# Patient Record
Sex: Female | Born: 1955 | Race: White | Hispanic: No | Smoking: Current every day smoker
Health system: Southern US, Community
[De-identification: ages and names within clinical notes are randomized; demographics above are authoritative.]

## PROBLEM LIST (undated history)

## (undated) DIAGNOSIS — F1020 Alcohol dependence, uncomplicated: Secondary | ICD-10-CM

## (undated) DIAGNOSIS — N838 Other noninflammatory disorders of ovary, fallopian tube and broad ligament: Secondary | ICD-10-CM

## (undated) DIAGNOSIS — Z720 Tobacco use: Secondary | ICD-10-CM

## (undated) DIAGNOSIS — F1010 Alcohol abuse, uncomplicated: Secondary | ICD-10-CM

## (undated) DIAGNOSIS — J45909 Unspecified asthma, uncomplicated: Secondary | ICD-10-CM

## (undated) HISTORY — PX: BREAST SURGERY: SHX581

## (undated) HISTORY — PX: ABDOMINAL HYSTERECTOMY: SHX81

## (undated) HISTORY — DX: Tobacco use: Z72.0

## (undated) HISTORY — DX: Other noninflammatory disorders of ovary, fallopian tube and broad ligament: N83.8

## (undated) HISTORY — DX: Alcohol abuse, uncomplicated: F10.10

## (undated) HISTORY — PX: PLACEMENT OF BREAST IMPLANTS: SHX6334

---

## 1997-09-22 ENCOUNTER — Other Ambulatory Visit: Admission: RE | Admit: 1997-09-22 | Discharge: 1997-09-22 | Payer: Self-pay | Admitting: Obstetrics and Gynecology

## 1997-11-03 ENCOUNTER — Ambulatory Visit (HOSPITAL_COMMUNITY): Admission: RE | Admit: 1997-11-03 | Discharge: 1997-11-03 | Payer: Self-pay | Admitting: Obstetrics and Gynecology

## 1998-09-25 ENCOUNTER — Other Ambulatory Visit: Admission: RE | Admit: 1998-09-25 | Discharge: 1998-09-25 | Payer: Self-pay | Admitting: Obstetrics and Gynecology

## 1998-11-12 ENCOUNTER — Ambulatory Visit (HOSPITAL_COMMUNITY): Admission: RE | Admit: 1998-11-12 | Discharge: 1998-11-12 | Payer: Self-pay | Admitting: Obstetrics and Gynecology

## 1998-11-12 ENCOUNTER — Encounter: Payer: Self-pay | Admitting: Obstetrics and Gynecology

## 1999-10-13 ENCOUNTER — Other Ambulatory Visit: Admission: RE | Admit: 1999-10-13 | Discharge: 1999-10-13 | Payer: Self-pay | Admitting: Internal Medicine

## 1999-11-23 ENCOUNTER — Ambulatory Visit (HOSPITAL_COMMUNITY): Admission: RE | Admit: 1999-11-23 | Discharge: 1999-11-23 | Payer: Self-pay | Admitting: Internal Medicine

## 1999-11-23 ENCOUNTER — Encounter: Payer: Self-pay | Admitting: Internal Medicine

## 2000-12-13 ENCOUNTER — Encounter: Payer: Self-pay | Admitting: Obstetrics and Gynecology

## 2000-12-13 ENCOUNTER — Ambulatory Visit (HOSPITAL_COMMUNITY): Admission: RE | Admit: 2000-12-13 | Discharge: 2000-12-13 | Payer: Self-pay | Admitting: Obstetrics and Gynecology

## 2001-12-17 ENCOUNTER — Encounter: Payer: Self-pay | Admitting: Obstetrics and Gynecology

## 2001-12-17 ENCOUNTER — Ambulatory Visit (HOSPITAL_COMMUNITY): Admission: RE | Admit: 2001-12-17 | Discharge: 2001-12-17 | Payer: Self-pay | Admitting: Obstetrics and Gynecology

## 2002-11-14 ENCOUNTER — Other Ambulatory Visit: Admission: RE | Admit: 2002-11-14 | Discharge: 2002-11-14 | Payer: Self-pay | Admitting: Obstetrics and Gynecology

## 2006-11-10 ENCOUNTER — Ambulatory Visit (HOSPITAL_COMMUNITY): Admission: RE | Admit: 2006-11-10 | Discharge: 2006-11-10 | Payer: Self-pay | Admitting: *Deleted

## 2006-11-10 ENCOUNTER — Encounter (INDEPENDENT_AMBULATORY_CARE_PROVIDER_SITE_OTHER): Payer: Self-pay | Admitting: *Deleted

## 2010-01-31 DIAGNOSIS — K635 Polyp of colon: Secondary | ICD-10-CM

## 2010-01-31 HISTORY — DX: Polyp of colon: K63.5

## 2010-06-15 NOTE — Op Note (Signed)
NAMEELLYSON, RARICK               ACCOUNT NO.:  [id number]   MEDICAL RECORD NO.:  [id number]          PATIENT TYPE:  AMB   LOCATION:  ENDO                         FACILITY:  [facility]   PHYSICIAN:  Georgiana Spinner, M.D.    DATE OF BIRTH:  28-Mar-1955   DATE OF PROCEDURE:  11/10/2006  DATE OF DISCHARGE:                               OPERATIVE REPORT   PROCEDURE:  Colonoscopy.   INDICATIONS:  Colon polyp.  Colon cancer screening.   ANESTHESIA:  Fentanyl 75 mcg, Versed 7 mg.   PROCEDURE:  With the patient mildly sedated in the left lateral  decubitus position, the Pentax videoscopic colonoscope was inserted in  the rectum and passed under direct vision to the cecum, identified by  ileocecal valve and appendiceal orifice.  Of note, prep was suboptimal  in that there was thick brownish liquid material coating the colon  throughout.  The patient admitted to only taking half the prep but from  this point after clearing the cecum as best we could with rinse and  suction, the colonoscope was slowly withdrawn taking circumferential  views of colonic mucosa, stopping at approximately 25 cm from the anal  verge at which point two polyps were seen.  One was a flat small polyp  which was removed using hot biopsy forceps technique.  The other was an  upright polyp that was removed using snare cautery technique, both with  a setting of 20/150 blended current.  Both tissue polyps were retrieved  for pathology.  The endoscope was then withdrawn all the way to the  rectum which appeared normal on direct, showed hemorrhoids on  retroflexed view.  The endoscope was straightened and withdrawn.  The  patient's vital signs and pulse oximeter remained stable.  The patient  tolerated the procedure well without apparent complications.   FINDINGS:  Two polyps as described above.  Internal hemorrhoids.  Await  biopsy report.  The patient will call me for results and follow up with  me as an outpatient.       ______________________________  Georgiana Spinner, M.D.     GMO/MEDQ  D:  11/10/2006  T:  11/11/2006  Job:  [id number]

## 2010-06-15 NOTE — Op Note (Signed)
Yvette Barber, Yvette Barber               ACCOUNT NO.:  [id number]   MEDICAL RECORD NO.:  [id number]          PATIENT TYPE:  AMB   LOCATION:  ENDO                         FACILITY[facility]  [facility]   PHYSICIAN:  Georgiana Spinner, M.D.    DATE OF BIRTH:  October 03, 1955   DATE OF PROCEDURE:  11/10/2006  DATE OF DISCHARGE:                               OPERATIVE REPORT   OPERATION/PROCEDURE:  Upper endoscopy.   INDICATIONS:  Gastroesophageal reflux disease.   ANESTHESIA:  Fentanyl 100 mcg, Versed 10 mg, Phenergan 25 mg, Benadryl  12 mg.   PROCEDURE:  With the patient mildly sedated in the left lateral  decubitus position, the Pentax videoscopic endoscope was inserted in the  mouth and passed under direct vision through the esophagus, which  appeared normal, until we reached the distal esophagus and there  appeared to be some small islands of Barrett's esophagus, photographed  and biopsied.  We entered into the stomach.  Fundus, body, antrum,  duodenal bulb, and second portion of the duodenum were visualized.  From  this point the endoscope was slowly withdrawn, taking circumferential  views of duodenal mucosa until the endoscope had been pulled back into  the stomach, placed in retroflexion to view the stomach from below. The  endoscope was straightened and withdrawn taking circumferential views of  the remaining gastric and esophageal mucosa.  The patient's vital signs  and pulse oximeter remained stable.  The patient tolerated the procedure  well without apparent complications.   FINDINGS:  Questionable areas of Barrett's esophagus biopsied.  Await  biopsy reports.  The patient will call me for results and follow up with  me as an outpatient.  Proceed to colonoscopy as planned           ______________________________  Georgiana Spinner, M.D.     GMO/MEDQ  D:  11/10/2006  T:  11/11/2006  Job:  [id number]

## 2014-01-31 DIAGNOSIS — R569 Unspecified convulsions: Secondary | ICD-10-CM

## 2014-01-31 DIAGNOSIS — F10231 Alcohol dependence with withdrawal delirium: Secondary | ICD-10-CM

## 2014-01-31 DIAGNOSIS — F10931 Alcohol use, unspecified with withdrawal delirium: Secondary | ICD-10-CM

## 2014-01-31 HISTORY — DX: Alcohol dependence with withdrawal delirium: F10.231

## 2014-01-31 HISTORY — DX: Alcohol use, unspecified with withdrawal delirium: F10.931

## 2014-01-31 HISTORY — DX: Unspecified convulsions: R56.9

## 2016-02-01 DIAGNOSIS — R569 Unspecified convulsions: Secondary | ICD-10-CM

## 2016-02-01 DIAGNOSIS — F10939 Alcohol use, unspecified with withdrawal, unspecified: Secondary | ICD-10-CM

## 2016-02-01 DIAGNOSIS — Z87898 Personal history of other specified conditions: Secondary | ICD-10-CM

## 2016-02-01 DIAGNOSIS — F10239 Alcohol dependence with withdrawal, unspecified: Secondary | ICD-10-CM

## 2016-02-01 HISTORY — DX: Alcohol dependence with withdrawal, unspecified: F10.239

## 2016-02-01 HISTORY — DX: Personal history of other specified conditions: Z87.898

## 2016-02-01 HISTORY — DX: Alcohol use, unspecified with withdrawal, unspecified: F10.939

## 2016-02-01 HISTORY — DX: Unspecified convulsions: R56.9

## 2016-11-07 ENCOUNTER — Encounter (HOSPITAL_COMMUNITY): Payer: Self-pay | Admitting: Emergency Medicine

## 2016-11-07 ENCOUNTER — Emergency Department (HOSPITAL_COMMUNITY): Payer: Self-pay

## 2016-11-07 ENCOUNTER — Inpatient Hospital Stay (HOSPITAL_COMMUNITY)
Admission: EM | Admit: 2016-11-07 | Discharge: 2016-11-10 | DRG: 897 | Disposition: A | Discharge status: Home / Self Care | Payer: Self-pay | Attending: Internal Medicine | Admitting: Internal Medicine

## 2016-11-07 DIAGNOSIS — F10939 Alcohol use, unspecified with withdrawal, unspecified: Secondary | ICD-10-CM | POA: Diagnosis present

## 2016-11-07 DIAGNOSIS — F10229 Alcohol dependence with intoxication, unspecified: Secondary | ICD-10-CM | POA: Diagnosis present

## 2016-11-07 DIAGNOSIS — R4702 Dysphasia: Secondary | ICD-10-CM | POA: Diagnosis present

## 2016-11-07 DIAGNOSIS — Y900 Blood alcohol level of less than 20 mg/100 ml: Secondary | ICD-10-CM | POA: Diagnosis present

## 2016-11-07 DIAGNOSIS — F419 Anxiety disorder, unspecified: Secondary | ICD-10-CM | POA: Diagnosis present

## 2016-11-07 DIAGNOSIS — Z90710 Acquired absence of both cervix and uterus: Secondary | ICD-10-CM

## 2016-11-07 DIAGNOSIS — N838 Other noninflammatory disorders of ovary, fallopian tube and broad ligament: Secondary | ICD-10-CM | POA: Diagnosis present

## 2016-11-07 DIAGNOSIS — R569 Unspecified convulsions: Secondary | ICD-10-CM

## 2016-11-07 DIAGNOSIS — F1010 Alcohol abuse, uncomplicated: Secondary | ICD-10-CM | POA: Diagnosis present

## 2016-11-07 DIAGNOSIS — Z811 Family history of alcohol abuse and dependence: Secondary | ICD-10-CM

## 2016-11-07 DIAGNOSIS — Z9882 Breast implant status: Secondary | ICD-10-CM

## 2016-11-07 DIAGNOSIS — F17210 Nicotine dependence, cigarettes, uncomplicated: Secondary | ICD-10-CM | POA: Diagnosis present

## 2016-11-07 DIAGNOSIS — Z8249 Family history of ischemic heart disease and other diseases of the circulatory system: Secondary | ICD-10-CM

## 2016-11-07 DIAGNOSIS — N839 Noninflammatory disorder of ovary, fallopian tube and broad ligament, unspecified: Secondary | ICD-10-CM | POA: Diagnosis present

## 2016-11-07 DIAGNOSIS — G459 Transient cerebral ischemic attack, unspecified: Secondary | ICD-10-CM

## 2016-11-07 DIAGNOSIS — I700 Atherosclerosis of aorta: Secondary | ICD-10-CM | POA: Diagnosis present

## 2016-11-07 DIAGNOSIS — I071 Rheumatic tricuspid insufficiency: Secondary | ICD-10-CM | POA: Diagnosis present

## 2016-11-07 DIAGNOSIS — Z720 Tobacco use: Secondary | ICD-10-CM

## 2016-11-07 DIAGNOSIS — J45909 Unspecified asthma, uncomplicated: Secondary | ICD-10-CM | POA: Diagnosis present

## 2016-11-07 DIAGNOSIS — R4701 Aphasia: Secondary | ICD-10-CM | POA: Diagnosis present

## 2016-11-07 DIAGNOSIS — R443 Hallucinations, unspecified: Secondary | ICD-10-CM | POA: Diagnosis not present

## 2016-11-07 DIAGNOSIS — F10239 Alcohol dependence with withdrawal, unspecified: Principal | ICD-10-CM | POA: Diagnosis present

## 2016-11-07 HISTORY — DX: Alcohol dependence, uncomplicated: F10.20

## 2016-11-07 HISTORY — DX: Unspecified asthma, uncomplicated: J45.909

## 2016-11-07 LAB — COMPREHENSIVE METABOLIC PANEL
ALT: 18 U/L (ref 14–54)
ANION GAP: 15 (ref 5–15)
AST: 23 U/L (ref 15–41)
Albumin: 4.3 g/dL (ref 3.5–5.0)
Alkaline Phosphatase: 73 U/L (ref 38–126)
BILIRUBIN TOTAL: 0.9 mg/dL (ref 0.3–1.2)
BUN: 12 mg/dL (ref 6–20)
CHLORIDE: 96 mmol/L — AB (ref 101–111)
CO2: 26 mmol/L (ref 22–32)
Calcium: 9 mg/dL (ref 8.9–10.3)
Creatinine, Ser: 0.7 mg/dL (ref 0.44–1.00)
Glucose, Bld: 97 mg/dL (ref 65–99)
Potassium: 3.9 mmol/L (ref 3.5–5.1)
Sodium: 137 mmol/L (ref 135–145)
TOTAL PROTEIN: 8 g/dL (ref 6.5–8.1)

## 2016-11-07 LAB — URINALYSIS, ROUTINE W REFLEX MICROSCOPIC
Bilirubin Urine: NEGATIVE
GLUCOSE, UA: NEGATIVE mg/dL
Hgb urine dipstick: NEGATIVE
Ketones, ur: 80 mg/dL — AB
NITRITE: NEGATIVE
Protein, ur: 30 mg/dL — AB
SPECIFIC GRAVITY, URINE: 1.026 (ref 1.005–1.030)
pH: 5 (ref 5.0–8.0)

## 2016-11-07 LAB — RAPID URINE DRUG SCREEN, HOSP PERFORMED
Amphetamines: NOT DETECTED
BARBITURATES: NOT DETECTED
Benzodiazepines: POSITIVE — AB
Cocaine: NOT DETECTED
OPIATES: NOT DETECTED
TETRAHYDROCANNABINOL: NOT DETECTED

## 2016-11-07 LAB — CBC
HEMATOCRIT: 40.5 % (ref 36.0–46.0)
HEMOGLOBIN: 13.9 g/dL (ref 12.0–15.0)
MCH: 31.3 pg (ref 26.0–34.0)
MCHC: 34.3 g/dL (ref 30.0–36.0)
MCV: 91.2 fL (ref 78.0–100.0)
Platelets: 277 10*3/uL (ref 150–400)
RBC: 4.44 MIL/uL (ref 3.87–5.11)
RDW: 13.4 % (ref 11.5–15.5)
WBC: 10.2 10*3/uL (ref 4.0–10.5)

## 2016-11-07 LAB — I-STAT CHEM 8, ED
BUN: 12 mg/dL (ref 6–20)
CALCIUM ION: 1.09 mmol/L — AB (ref 1.15–1.40)
CREATININE: 0.6 mg/dL (ref 0.44–1.00)
Chloride: 97 mmol/L — ABNORMAL LOW (ref 101–111)
Glucose, Bld: 93 mg/dL (ref 65–99)
HEMATOCRIT: 42 % (ref 36.0–46.0)
HEMOGLOBIN: 14.3 g/dL (ref 12.0–15.0)
Potassium: 4.1 mmol/L (ref 3.5–5.1)
SODIUM: 136 mmol/L (ref 135–145)
TCO2: 29 mmol/L (ref 22–32)

## 2016-11-07 LAB — DIFFERENTIAL
BASOS ABS: 0 10*3/uL (ref 0.0–0.1)
BASOS PCT: 0 %
EOS ABS: 0 10*3/uL (ref 0.0–0.7)
EOS PCT: 0 %
LYMPHS ABS: 1 10*3/uL (ref 0.7–4.0)
Lymphocytes Relative: 10 %
MONO ABS: 0.7 10*3/uL (ref 0.1–1.0)
MONOS PCT: 7 %
Neutro Abs: 8.4 10*3/uL — ABNORMAL HIGH (ref 1.7–7.7)
Neutrophils Relative %: 83 %

## 2016-11-07 LAB — I-STAT TROPONIN, ED: TROPONIN I, POC: 0.01 ng/mL (ref 0.00–0.08)

## 2016-11-07 LAB — I-STAT BETA HCG BLOOD, ED (MC, WL, AP ONLY)

## 2016-11-07 LAB — APTT: APTT: 26 s (ref 24–36)

## 2016-11-07 LAB — ETHANOL

## 2016-11-07 LAB — PROTIME-INR
INR: 0.93
Prothrombin Time: 12.3 seconds (ref 11.4–15.2)

## 2016-11-07 LAB — SALICYLATE LEVEL

## 2016-11-07 LAB — ACETAMINOPHEN LEVEL

## 2016-11-07 MED ORDER — LORAZEPAM 1 MG PO TABS: 0.0000 mg | ORAL_TABLET | Freq: Four times a day (QID) | ORAL | Status: DC

## 2016-11-07 MED ORDER — THIAMINE HCL 100 MG/ML IJ SOLN
100.0000 mg | Freq: Every day | INTRAMUSCULAR | Status: DC
  Filled 2016-11-07: qty 2

## 2016-11-07 MED ORDER — VITAMIN B-1 100 MG PO TABS
100.0000 mg | ORAL_TABLET | Freq: Every day | ORAL | Status: DC
  Administered 2016-11-08 – 2016-11-10 (×3): 100 mg via ORAL
  Filled 2016-11-07 (×3): qty 1

## 2016-11-07 MED ORDER — LORAZEPAM 2 MG/ML IJ SOLN
0.0000 mg | Freq: Two times a day (BID) | INTRAMUSCULAR | Status: DC
Start: 2016-11-10 — End: 2016-11-08

## 2016-11-07 MED ORDER — LORAZEPAM 2 MG/ML IJ SOLN
0.0000 mg | Freq: Four times a day (QID) | INTRAMUSCULAR | Status: DC
Start: 2016-11-07 — End: 2016-11-08
  Administered 2016-11-07 – 2016-11-08 (×2): 1 mg via INTRAVENOUS
  Filled 2016-11-07 (×2): qty 1

## 2016-11-07 MED ORDER — LORAZEPAM 1 MG PO TABS: 0.0000 mg | ORAL_TABLET | Freq: Two times a day (BID) | ORAL | Status: DC

## 2016-11-07 NOTE — H&P (Signed)
Yvette Barber FGH:[id number] DOB: May 13, 1955 DOA: 11/07/2016     PCP: Patient, No Pcp Per   Outpatient Specialists: None   Patient coming from:    home Lives alone,        Chief Complaint: Alcohol related withdrawal seizures  HPI: Yvette Barber is a 61 y.o. female with medical history significant of alcohol abuse, hx of alcohol withdrawal seizure and DT's 2 yeas ago     Presented with seizure-like activity reported drinking 12 beers a day but try to stop for past 3 days. Develop seizure-like activity her friends called EMS initially but patient refuses transport she continued to have seizures while at home described as freezing and hands shaking and in front of her unable to talk while this was happening. She does have episode of postictal state she was given Versed in  Route  In ER patient was noted to have hard time speaking otherwise no localized symptoms such as numbness tingling or weakness no headache or head injury recently no chest pain shortness of breath. Reports trouble speaking for past 3 days with expressive aphasia that is intermittent. Reports last ETOH was one beer today She is trying to quit. Have had nausea and vomiting today.  Repots last seizure episode was 2 years ago also  due to Alcohol withdrawal.  Patient states she had had a hysterectomy at the age of 77 secondary to endometriosis and fibroids. She has been having abdominal distention for the past year and a half she fought that was secondary to weight gain. Patient has no family in the area she is currently staying with her friend.  IN ER:  Temp (24hrs), Avg:98.5 F (36.9 C), Min:98.4 F (36.9 C), Max:98.6 F (37 C)      on arrival  ED Triage Vitals  Enc Vitals Group     BP 11/07/16 2011 (!) 147/93     Pulse Rate 11/07/16 2011 91     Resp 11/07/16 2011 17     Temp 11/07/16 2011 98.6 F (37 C)     Temp Source 11/07/16 2011 Oral     SpO2 11/07/16 2008 97 %     Weight 11/07/16 2011 150 lb (68  kg)     Height 11/07/16 2011 5\' 5"  (1.651 m)     Head Circumference --      Peak Flow --      Pain Score --      Pain Loc --      Pain Edu? --      Excl. in [place]? --     Latest RRR 17 satting 96% HR 82 BP 99/85  Na 136 K 4.1 BUN 12 glucose 93 Trop 0.01 INr 0.93 WCB 10.2 CV89.3 plt 810  Salicylate and tylenol undetectable  Alcohol level negative  CT head no acute changes  Following Medications were ordered in ER: Medications  LORazepam (ATIVAN) injection 0-4 mg (1 mg Intravenous Given 11/07/16 2245)    Or  LORazepam (ATIVAN) tablet 0-4 mg ( Oral See Alternative 11/07/16 2245)  LORazepam (ATIVAN) injection 0-4 mg (not administered)    Or  LORazepam (ATIVAN) tablet 0-4 mg (not administered)  thiamine (VITAMIN B-1) tablet 100 mg (not administered)    Or  thiamine (B-1) injection 100 mg (not administered)     ER provider discussed case with: Neurology Who recommends: CT angiogram head and neck transfer to [facility] for MRI of the brain and EEG monitoring We'll see patient in consult on arrival  Hospitalist was called for admission for alcohol withdrawal seizures and expressive aphasia  Review of Systems:    Pertinent positives include: Trouble speaking seizure-like episode  Constitutional:  No weight loss, night sweats, Fevers, chills, fatigue, weight loss  HEENT:  No headaches, Difficulty swallowing,Tooth/dental problems,Sore throat,  No sneezing, itching, ear ache, nasal congestion, post nasal drip,  Cardio-vascular:  No chest pain, Orthopnea, PND, anasarca, dizziness, palpitations.no Bilateral lower extremity swelling  GI:  No heartburn, indigestion, abdominal pain, nausea, vomiting, diarrhea, change in bowel habits, loss of appetite, melena, blood in stool, hematemesis Resp:  no shortness of breath at rest. No dyspnea on exertion, No excess mucus, no productive cough, No non-productive cough, No coughing up of blood.No change in color of mucus.No wheezing. Skin:    no rash or lesions. No jaundice GU:  no dysuria, change in color of urine, no urgency or frequency. No straining to urinate.  No flank pain.  Musculoskeletal:  No joint pain or no joint swelling. No decreased range of motion. No back pain.  Psych:  No change in mood or affect. No depression or anxiety. No memory loss.  Neuro: no localizing neurological complaints, no tingling, no weakness, no double vision, no gait abnormality, no slurred speech, no confusion  As per HPI otherwise 10 point review of systems negative.   Past Medical History: Past Medical History:  Diagnosis Date  . Alcoholism ([place])   . Asthma    Past Surgical History:  Procedure Laterality Date  . ABDOMINAL HYSTERECTOMY    . BREAST SURGERY    . PLACEMENT OF BREAST IMPLANTS       Social History:  Ambulatory  independently    reports that she has been smoking Cigarettes.  She has been smoking about 1.00 pack per day. She has never used smokeless tobacco. She reports that she drinks alcohol. She reports that she does not use drugs.  Allergies:  No Known Allergies     Family History:   Family History  Problem Relation Age of Onset  . Alcohol abuse Father   . CAD Other   . Dementia Mother   . Diabetes Neg Hx     Medications: Prior to Admission medications   Medication Sig Start Date End Date Taking? Authorizing Provider  loratadine (CLARITIN) 10 MG tablet Take 10 mg by mouth daily as needed for allergies.   Yes [provider]  Multiple Vitamins-Minerals (MULTIVITAMIN ADULT) TABS Take 1 tablet by mouth daily.   Yes [provider]    Physical Exam: Patient Vitals for the past 24 hrs:  BP Temp Temp src Pulse Resp SpO2 Height Weight  11/07/16 2300 99/85 - - 82 17 96 % - -  11/07/16 2239 - 98.5 F (36.9 C) - - - - - -  11/07/16 2239 - 98.4 F (36.9 C) - - - - - -  11/07/16 2232 - - - - - 99 % - -  11/07/16 2231 133/83 98.6 F (37 C) Oral 88 17 99 % - -  11/07/16 2230 133/83  - - 81 14 99 % - -  11/07/16 2229 (!) 145/91 - - (!) 106 - - - -  11/07/16 2200 140/86 - - 88 (!) 24 99 % - -  11/07/16 2130 130/76 - - 88 20 98 % - -  11/07/16 2100 (!) 149/82 - - 94 18 98 % - -  11/07/16 2030 129/80 - - 90 17 97 % - -  11/07/16 2011 (!) 147/93 98.6 F (  37 C) Oral 91 17 98 % 5\' 5"  (1.651 m) 68 kg (150 lb)  11/07/16 2008 - - - - - 97 % - -    1. General:  in No Acute distress Chronically ill-appearing 2. Psychological: Alert and  Oriented 3. Head/ENT: Dry Mucous Membranes                          Head Non traumatic, neck supple                           Poor Dentition 4. SKIN:   decreased Skin turgor,  Skin clean Dry and intact no rash 5. Heart: Regular rate and rhythm no  Murmur, no Rub or gallop 6. Lungs: Clear to auscultation bilaterally, no wheezes or crackles   7. Abdomen: Soft,  non-tender,  distended large palpable mass  bowel sounds present 8. Lower extremities: no clubbing, cyanosis, or edema 9. Neurologically   strength 5 out of 5 in all 4 extremities cranial nerves II through XII intact 10. MSK: Normal range of motion   body mass index is 24.96 kg/m.  Labs on Admission:   Labs on Admission: I have personally reviewed following labs and imaging studies  CBC:  Recent Labs Lab 11/07/16 2155 11/07/16 2216  WBC 10.2  --   NEUTROABS 8.4*  --   HGB 13.9 14.3  HCT 40.5 42.0  MCV 91.2  --   PLT 277  --    Basic Metabolic Panel:  Recent Labs Lab 11/07/16 2155 11/07/16 2216  NA 137 136  K 3.9 4.1  CL 96* 97*  CO2 26  --   GLUCOSE 97 93  BUN 12 12  CREATININE 0.70 0.60  CALCIUM 9.0  --    GFR: Estimated Creatinine Clearance: 67.3 mL/min (by C-G formula based on SCr of 0.6 mg/dL). Liver Function Tests:  Recent Labs Lab 11/07/16 2155  AST 23  ALT 18  ALKPHOS 73  BILITOT 0.9  PROT 8.0  ALBUMIN 4.3   No results for input(s): LIPASE, AMYLASE in the last 168 hours. No results for input(s): AMMONIA in the last 168  hours. Coagulation Profile:  Recent Labs Lab 11/07/16 2155  INR 0.93   Cardiac Enzymes: No results for input(s): CKTOTAL, CKMB, CKMBINDEX, TROPONINI in the last 168 hours. BNP (last 3 results) No results for input(s): PROBNP in the last 8760 hours. HbA1C: No results for input(s): HGBA1C in the last 72 hours. CBG: No results for input(s): GLUCAP in the last 168 hours. Lipid Profile: No results for input(s): CHOL, HDL, LDLCALC, TRIG, CHOLHDL, LDLDIRECT in the last 72 hours. Thyroid Function Tests: No results for input(s): TSH, T4TOTAL, FREET4, T3FREE, THYROIDAB in the last 72 hours. Anemia Panel: No results for input(s): VITAMINB12, FOLATE, FERRITIN, TIBC, IRON, RETICCTPCT in the last 72 hours. Urine analysis: No results found for: COLORURINE, APPEARANCEUR, LABSPEC, PHURINE, GLUCOSEU, HGBUR, BILIRUBINUR, KETONESUR, PROTEINUR, UROBILINOGEN, NITRITE, LEUKOCYTESUR Sepsis Labs: @LABRCNTIP (procalcitonin:4,lacticidven:4) )No results found for this or any previous visit (from the past 240 hour(s)).    UA ordered  No results found for: HGBA1C  Estimated Creatinine Clearance: 67.3 mL/min (by C-G formula based on SCr of 0.6 mg/dL).  BNP (last 3 results) No results for input(s): PROBNP in the last 8760 hours.   ECG REPORT  Independently reviewed Rate: 92  Rhythm: right axis deviation ST&T Change: No acute ischemic changes  QTC 448  Filed Weights   11/07/16  2011  Weight: 68 kg (150 lb)     Cultures: No results found for: SDES, SPECREQUEST, CULT, REPTSTATUS   Radiological Exams on Admission: Ct Abdomen Pelvis Wo Contrast  Result Date: 11/08/2016 CLINICAL DATA:  Acute onset of generalized abdominal distention and abdominal pain. Initial encounter. EXAM: CT ABDOMEN AND PELVIS WITHOUT CONTRAST TECHNIQUE: Multidetector CT imaging of the abdomen and pelvis was performed following the standard protocol without IV contrast. COMPARISON:  None. FINDINGS: Lower chest: Minimal left  basilar scarring is noted. Mild coronary artery calcifications are seen. There is dense calcification about the patient's bilateral breast implants. Hepatobiliary: The liver is unremarkable in appearance. The gallbladder is unremarkable in appearance. The common bile duct remains normal in caliber. Pancreas: The pancreas is within normal limits. Spleen: A tiny hypodensity within the spleen is nonspecific. The spleen is otherwise unremarkable. Adrenals/Urinary Tract: The adrenal glands are unremarkable in appearance. A small left renal cyst is noted. The kidneys are difficult to fully assess due to contrast in the renal calyces and ureters. No obstructing ureteral stones are identified. No perinephric stranding is seen. There is no evidence of hydronephrosis. Stomach/Bowel: The stomach is unremarkable in appearance. The small bowel is within normal limits. The appendix is not well characterized; there is no evidence for appendicitis. Mild diverticulosis is noted at the mid sigmoid colon, without evidence of diverticulitis. Vascular/Lymphatic: Scattered calcification is seen along the abdominal aorta and its branches. The abdominal aorta is otherwise grossly unremarkable. The inferior vena cava is grossly unremarkable. No retroperitoneal lymphadenopathy is seen. No pelvic sidewall lymphadenopathy is identified. Reproductive: There is a very large complex cystic mass occupying the lower abdomen and pelvis, measuring approximately 21.4 x 21.3 x 9.8 cm. 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 A small amount of associated free fluid is noted within the pelvis. The patient is status post hysterectomy. The bladder is largely decompressed and grossly unremarkable, with a small amount of contrast noted in the bladder. Other: No additional soft tissue abnormalities are seen. Musculoskeletal: No acute osseous abnormalities  are identified. Endplate sclerotic change is noted at L4-L5, with vacuum phenomenon at the lower lumbar spine. The visualized musculature is unremarkable in appearance. IMPRESSION: 1. Very large complex cystic mass occupying the lower abdomen and pelvis, measuring approximately 21.4 x 21.3 x 9.8 cm. 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 Small amount of associated free fluid within the pelvis. 2. Scattered aortic atherosclerosis. 3. Mild coronary artery calcification. 4. Mild diverticulosis at the mid sigmoid colon, without evidence of diverticulitis. 5. Small left renal cyst. These results were called by telephone at the time of interpretation on 11/08/2016 at 3:15 am to Dr. Roxanne Mins, who verbally acknowledged these results. Electronically Signed   By: Garald Balding M.D.   On: 11/08/2016 02:01   Ct Angio Head W Or Wo Contrast  Result Date: 11/08/2016 CLINICAL DATA:  Seizures EXAM: CT ANGIOGRAPHY HEAD AND NECK TECHNIQUE: Multidetector CT imaging of the head and neck was performed using the standard protocol during bolus administration of intravenous contrast. Multiplanar CT image reconstructions and MIPs were obtained to evaluate the vascular anatomy. Carotid stenosis measurements (when applicable) are obtained utilizing NASCET criteria, using the distal internal carotid diameter as the denominator. CONTRAST:  100 mL Isovue 370 COMPARISON:  Head CT 11/07/2016 FINDINGS: CTA NECK FINDINGS Aortic arch: There  is no aneurysm or dissection of the visualized ascending aorta or aortic arch. Normal 3 vessel aortic branching pattern. The visualized proximal subclavian arteries are normal. Mild calcification of the aortic arch. Right carotid system: The right common carotid origin is widely patent. There is no common carotid or internal carotid artery dissection or aneurysm. Mixed calcified and noncalcified plaque at the right  carotid bifurcation without associated hemodynamically significant stenosis. Left carotid system: The left common carotid origin is widely patent. There is no common carotid or internal carotid artery dissection or aneurysm. There is noncalcified plaque within the left common carotid artery causing less than 50% stenosis. There is mixed calcified and noncalcified plaque at the carotid bifurcation extending into the proximal left ICA, causing no hemodynamically significant stenosis. Vertebral arteries: The vertebral system is codominant. Both vertebral artery origins are normal. Both vertebral arteries are normal to their confluence with the basilar artery. Skeleton: There is no bony spinal canal stenosis. No lytic or blastic lesions. Other neck: The nasopharynx is clear. The oropharynx and hypopharynx are normal. The epiglottis is normal. The supraglottic larynx, glottis and subglottic larynx are normal. No retropharyngeal collection. The parapharyngeal spaces are preserved. The parotid and submandibular glands are normal. No sialolithiasis or salivary ductal dilatation. The thyroid gland is normal. There is no cervical lymphadenopathy. Upper chest: No pneumothorax or pleural effusion. No nodules or masses. Review of the MIP images confirms the above findings CTA HEAD FINDINGS Anterior circulation: --Intracranial internal carotid arteries: Multifocal atherosclerotic disease of both internal carotid arteries at the skullbase without flow-limiting stenosis. --Anterior cerebral arteries: Normal. --Middle cerebral arteries: Normal. --Posterior communicating arteries: Absent bilaterally. Posterior circulation: --Posterior cerebral arteries: Normal. --Superior cerebellar arteries: Normal. --Basilar artery: Normal. --Anterior inferior cerebellar arteries: Normal. --Posterior inferior cerebellar arteries: Normal. Venous sinuses: As permitted by contrast timing, patent. Anatomic variants: None Delayed phase: No parenchymal  contrast enhancement. Review of the MIP images confirms the above findings IMPRESSION: 1. No emergent large vessel occlusion or high-grade intracranial stenosis. 2. Bilateral mixed calcified and noncalcified atherosclerosis of the common carotid, cervical internal carotid and proximal intracranial internal carotid arteries. No hemodynamically significant stenosis. 3.  Aortic Atherosclerosis (ICD10-I70.0). Electronically Signed   By: Ulyses Jarred M.D.   On: 11/08/2016 01:33   Ct Head Wo Contrast  Result Date: 11/07/2016 CLINICAL DATA:  Seizure streak alcohol withdrawal. EXAM: CT HEAD WITHOUT CONTRAST TECHNIQUE: Contiguous axial images were obtained from the base of the skull through the vertex without intravenous contrast. COMPARISON:  None. FINDINGS: Brain: No evidence of acute infarction, hemorrhage, hydrocephalus, extra-axial collection or mass lesion/mass effect. Mild brain parenchymal volume loss and periventricular microangiopathy. Vascular: Calcific atherosclerotic disease at the skullbase. Skull: Normal. Negative for fracture or focal lesion. Sinuses/Orbits: No acute finding. Other: None. IMPRESSION: No acute intracranial abnormality. Atrophy, chronic microvascular disease. Electronically Signed   By: Fidela Salisbury M.D.   On: 11/07/2016 22:06   Ct Angio Neck W Or Wo Contrast  Result Date: 11/08/2016 CLINICAL DATA:  Seizures EXAM: CT ANGIOGRAPHY HEAD AND NECK TECHNIQUE: Multidetector CT imaging of the head and neck was performed using the standard protocol during bolus administration of intravenous contrast. Multiplanar CT image reconstructions and MIPs were obtained to evaluate the vascular anatomy. Carotid stenosis measurements (when applicable) are obtained utilizing NASCET criteria, using the distal internal carotid diameter as the denominator. CONTRAST:  100 mL Isovue 370 COMPARISON:  Head CT 11/07/2016 FINDINGS: CTA NECK FINDINGS Aortic arch: There is no aneurysm or dissection of the  visualized ascending aorta  or aortic arch. Normal 3 vessel aortic branching pattern. The visualized proximal subclavian arteries are normal. Mild calcification of the aortic arch. Right carotid system: The right common carotid origin is widely patent. There is no common carotid or internal carotid artery dissection or aneurysm. Mixed calcified and noncalcified plaque at the right carotid bifurcation without associated hemodynamically significant stenosis. Left carotid system: The left common carotid origin is widely patent. There is no common carotid or internal carotid artery dissection or aneurysm. There is noncalcified plaque within the left common carotid artery causing less than 50% stenosis. There is mixed calcified and noncalcified plaque at the carotid bifurcation extending into the proximal left ICA, causing no hemodynamically significant stenosis. Vertebral arteries: The vertebral system is codominant. Both vertebral artery origins are normal. Both vertebral arteries are normal to their confluence with the basilar artery. Skeleton: There is no bony spinal canal stenosis. No lytic or blastic lesions. Other neck: The nasopharynx is clear. The oropharynx and hypopharynx are normal. The epiglottis is normal. The supraglottic larynx, glottis and subglottic larynx are normal. No retropharyngeal collection. The parapharyngeal spaces are preserved. The parotid and submandibular glands are normal. No sialolithiasis or salivary ductal dilatation. The thyroid gland is normal. There is no cervical lymphadenopathy. Upper chest: No pneumothorax or pleural effusion. No nodules or masses. Review of the MIP images confirms the above findings CTA HEAD FINDINGS Anterior circulation: --Intracranial internal carotid arteries: Multifocal atherosclerotic disease of both internal carotid arteries at the skullbase without flow-limiting stenosis. --Anterior cerebral arteries: Normal. --Middle cerebral arteries: Normal. --Posterior  communicating arteries: Absent bilaterally. Posterior circulation: --Posterior cerebral arteries: Normal. --Superior cerebellar arteries: Normal. --Basilar artery: Normal. --Anterior inferior cerebellar arteries: Normal. --Posterior inferior cerebellar arteries: Normal. Venous sinuses: As permitted by contrast timing, patent. Anatomic variants: None Delayed phase: No parenchymal contrast enhancement. Review of the MIP images confirms the above findings IMPRESSION: 1. No emergent large vessel occlusion or high-grade intracranial stenosis. 2. Bilateral mixed calcified and noncalcified atherosclerosis of the common carotid, cervical internal carotid and proximal intracranial internal carotid arteries. No hemodynamically significant stenosis. 3.  Aortic Atherosclerosis (ICD10-I70.0). Electronically Signed   By: Ulyses Jarred M.D.   On: 11/08/2016 01:33    Chart has been reviewed    Assessment/Plan   61 y.o. 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 TIA (transient ischemic attack) discussed and neurology will transfer to [facility] obtain MRI of the brain this also be helpful to evaluate for any metastatic spread as well as Wernicke's Korsakoff syndrome . Alcohol withdrawal ([place]) And history of DTs and withdrawal seizures Order CIWA protocol monitor and step down if patient stabilizes can transfer to telemetry, administer IV thiamine . 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 Aphasia intermittent aphasia currently seems to be improving evaluate for possible TIA/CVA/metastases . Alcohol abuse social work consult ordered Other plan as per orders.  DVT prophylaxis:  SCD   Code Status:   FULL CODE   as per patient    Family Communication:   Family not  at  Bedside    Disposition Plan     To home once workup is complete and patient is stable  Would benefit from PT/OT eval prior to DC  ordered                       Social Work  Nutrition  consulted                          Consults called: Neurology    Admission status:    obs   Level of care    SDU      I have spent a total of 56 min on this admission   Mayumi Summerson 11/08/2016, 2:33 AM    [facility]  Pager [phone]   after 2 AM please page floor coverage PA If 7AM-7PM, please contact the day team taking care of the patient  [email or web address]  Password [email or web address]

## 2016-11-07 NOTE — ED Notes (Signed)
Bed: ZQ94 Expected date:  Expected time:  Means of arrival:  Comments: EMS ETOH Withdrawal-seizures

## 2016-11-07 NOTE — ED Notes (Signed)
Pt is aware a urine sample is needed, but is unable to provide one at this time. 

## 2016-11-07 NOTE — ED Notes (Signed)
Patient transported to CT 

## 2016-11-07 NOTE — ED Triage Notes (Signed)
Pt BIB EMS for seizures due to alcohol withdraw. Patient has no hx of seizures. Patient has been drinking 12 beers a day, which is less than she normally drinks. The individuals that the patient is staying with called EMS yesterday for same but patient refused transport. Per the people in the household, patient had 6-7 seizures. Per EMS, patient presenting with seizures characterized by freezing, hands shaking out in front of her, and attempting to talk but unable to. Patient then stops shaking and goes into a post-ictal state. Patient agitated and behavior bizarre on arrival. Patient given 2.5 mg versed en route. No seizure activity since versed given.

## 2016-11-07 NOTE — ED Provider Notes (Signed)
[facility] DEPT Provider Note   CSN: [id number] Arrival date & time: 11/07/16  2006     History   Chief Complaint Chief Complaint  Patient presents with  . Alcohol Intoxication  . Seizures    HPI Yvette Barber is a 61 y.o. female.  The history is provided by medical records and the patient.  Seizures   This is a new problem. The current episode started 2 days ago. The problem has not changed since onset.There were 6 to 10 seizures. The most recent episode lasted 30 to 120 seconds. Associated symptoms include confusion and speech difficulty. Pertinent negatives include no headaches, no visual disturbance, no chest pain, no cough, no nausea, no vomiting and no diarrhea. Characteristics include rhythmic jerking and loss of consciousness. The episode was witnessed. The seizures did not continue in the ED. Possible causes include change in alcohol use. There has been no fever.    Past Medical History:  Diagnosis Date  . Alcoholism ([place])   . Asthma     There are no active problems to display for this patient.   Past Surgical History:  Procedure Laterality Date  . ABDOMINAL HYSTERECTOMY    . BREAST SURGERY    . PLACEMENT OF BREAST IMPLANTS      OB History    No data available       Home Medications    Prior to Admission medications   Medication Sig Start Date End Date Taking? Authorizing Provider  loratadine (CLARITIN) 10 MG tablet Take 10 mg by mouth daily as needed for allergies.   Yes [provider]  Multiple Vitamins-Minerals (MULTIVITAMIN ADULT) TABS Take 1 tablet by mouth daily.   Yes [provider]    Family History No family history on file.  Social History Social History  Substance Use Topics  . Smoking status: Current Every Day Smoker    Packs/day: 1.00    Types: Cigarettes  . Smokeless tobacco: Never Used  . Alcohol use Yes     Comment: 13 beers per day     Allergies   Patient has no known allergies.   Review of  Systems Review of Systems  Constitutional: Negative for chills, diaphoresis, fatigue and fever.  HENT: Negative for congestion and rhinorrhea.   Eyes: Negative for visual disturbance.  Respiratory: Negative for cough, chest tightness and shortness of breath.   Cardiovascular: Negative for chest pain and palpitations.  Gastrointestinal: Negative for abdominal pain, diarrhea, nausea and vomiting.  Genitourinary: Negative for dyspareunia and flank pain.  Musculoskeletal: Negative for neck pain and neck stiffness.  Skin: Negative for rash and wound.  Neurological: Positive for seizures, loss of consciousness and speech difficulty. Negative for dizziness, light-headedness, numbness and headaches.  Psychiatric/Behavioral: Positive for confusion.  All other systems reviewed and are negative.    Physical Exam Updated Vital Signs BP (!) 147/93 (BP Location: Left Arm)   Pulse 91   Temp 98.6 F (37 C) (Oral)   Resp 17   Ht 5\' 5"  (1.651 m)   Wt 68 kg (150 lb)   SpO2 98%   BMI 24.96 kg/m   Physical Exam  Constitutional: She is oriented to person, place, and time. She appears well-developed and well-nourished. No distress.  HENT:  Head: Normocephalic and atraumatic.  Mouth/Throat: Oropharynx is clear and moist. No oropharyngeal exudate.  Eyes: Pupils are equal, round, and reactive to light. Conjunctivae and EOM are normal.  Neck: Normal range of motion.  Cardiovascular: Normal rate and intact distal pulses.  No murmur heard. Pulmonary/Chest: Effort normal and breath sounds normal. No stridor. No respiratory distress. She has no wheezes. She exhibits no tenderness.  Abdominal: Soft. There is no tenderness.  Musculoskeletal: She exhibits no tenderness.  Neurological: She is alert and oriented to person, place, and time. She is not disoriented. She displays no tremor. No cranial nerve deficit or sensory deficit. She exhibits normal muscle tone. Coordination and gait normal. GCS eye subscore  is 4. GCS verbal subscore is 5. GCS motor subscore is 6.  Intermittent expressive aphasia.   Skin: Capillary refill takes less than 2 seconds. No rash noted. She is not diaphoretic. No erythema.  Psychiatric: She has a normal mood and affect.  Nursing note and vitals reviewed.    ED Treatments / Results  Labs (all labs ordered are listed, but only abnormal results are displayed) Labs Reviewed  DIFFERENTIAL - Abnormal; Notable for the following:       Result Value   Neutro Abs 8.4 (*)    All other components within normal limits  COMPREHENSIVE METABOLIC PANEL - Abnormal; Notable for the following:    Chloride 96 (*)    All other components within normal limits  URINALYSIS, ROUTINE W REFLEX MICROSCOPIC - Abnormal; Notable for the following:    APPearance HAZY (*)    Ketones, ur 80 (*)    Protein, ur 30 (*)    Leukocytes, UA TRACE (*)    Bacteria, UA RARE (*)    Squamous Epithelial / LPF 6-30 (*)    All other components within normal limits  RAPID URINE DRUG SCREEN, HOSP PERFORMED - Abnormal; Notable for the following:    Benzodiazepines POSITIVE (*)    All other components within normal limits  ACETAMINOPHEN LEVEL - Abnormal; Notable for the following:    Acetaminophen (Tylenol), Serum <10 (*)    All other components within normal limits  I-STAT CHEM 8, ED - Abnormal; Notable for the following:    Chloride 97 (*)    Calcium, Ion 1.09 (*)    All other components within normal limits  PROTIME-INR  APTT  CBC  SALICYLATE LEVEL  ETHANOL  URINALYSIS, ROUTINE W REFLEX MICROSCOPIC  I-STAT TROPONIN, ED  I-STAT BETA HCG BLOOD, ED ([facility], [facility], [facility] ONLY)    EKG  EKG Interpretation  Date/Time:  Monday November 07 2016 20:18:03 EDT Ventricular Rate:  92 PR Interval:    QRS Duration: 84 QT Interval:  362 QTC Calculation: 448 R Axis:   84 Text Interpretation:  Sinus rhythm Borderline right axis deviation No prior ECG for comparison.  No STEMI Confirmed by Antony Blackbird [id number]) on  11/07/2016 10:08:32 PM       Radiology Ct Head Wo Contrast  Result Date: 11/07/2016 CLINICAL DATA:  Seizure streak alcohol withdrawal. EXAM: CT HEAD WITHOUT CONTRAST TECHNIQUE: Contiguous axial images were obtained from the base of the skull through the vertex without intravenous contrast. COMPARISON:  None. FINDINGS: Brain: No evidence of acute infarction, hemorrhage, hydrocephalus, extra-axial collection or mass lesion/mass effect. Mild brain parenchymal volume loss and periventricular microangiopathy. Vascular: Calcific atherosclerotic disease at the skullbase. Skull: Normal. Negative for fracture or focal lesion. Sinuses/Orbits: No acute finding. Other: None. IMPRESSION: No acute intracranial abnormality. Atrophy, chronic microvascular disease. Electronically Signed   By: Fidela Salisbury M.D.   On: 11/07/2016 22:06    Procedures Procedures (including critical care time)  Medications Ordered in ED Medications  LORazepam (ATIVAN) injection 0-4 mg (1 mg Intravenous Given 11/07/16 2245)    Or  LORazepam (ATIVAN) tablet 0-4 mg ( Oral See Alternative 11/07/16 2245)  LORazepam (ATIVAN) injection 0-4 mg (not administered)    Or  LORazepam (ATIVAN) tablet 0-4 mg (not administered)  thiamine (VITAMIN B-1) tablet 100 mg (not administered)    Or  thiamine (B-1) injection 100 mg (not administered)  iopamidol (ISOVUE-370) 76 % injection 100 mL (100 mLs Intravenous Contrast Given 11/08/16 0041)     Initial Impression / Assessment and Plan / ED Course  I have reviewed the triage vital signs and the nursing notes.  Pertinent labs & imaging results that were available during my care of the patient were reviewed by me and considered in my medical decision making (see chart for details).     Yvette Barber is a 61 y.o. female with a past medical history of alcoholism and asthma who presents with seizures. Patient brought in by EMS. According to EMS report to nursing, patient has had seizures for  the last 2 days. According to bystanders, patient had 6 or 7 seizures today. Patient has full body shaking and has difficulty speaking. Patient then stopped shaking and was postictal. Patient received Versed in route with EMS. No seizure seen since medications. On my evaluation, patient has intermittent expressive aphasia. Patient has a very difficult time speaking however she occasionally gets into episodes where she has minimal difficulty. She reports that she normally drinks a 12 pack a day for years but 3 days ago stop drinking. She says that she is also had one beer today There is clinical concern that her seizures are focal withdrawal seizures. Patient denies any other symptoms such as numbness, tingling, or weakness. She denies any recent injuries, headaches, nausea, vomiting, conservation, diarrhea, dysuria, palpitations, chest pain, or shortness of breath. Patient reports that her difficulty speaking has been going on for the last 3 days.  On exam, patient has a stuttering and expressive aphasia. Patient's aphasia is not constant but she gets very frustrated with her inability to communicate. Patient has no facial droop andno other focal neurologic deficits. Patient has normal sensation and strength in all extremity joints. Normal finger-nose-finger bilaterally. Lungs are clear and chest is nontender. No evidence of trauma.  On my abdominal exam, patient was nontender.   Patient on the aphasia component, there is concern for possible stroke. Patient was able to report that her speech difficulty began 3 days ago. Patient will not be a code stroke.  The patient's seizures do sound like withdrawal seizures given lack of epilepsy history and her change in alcohol consumption. CIWA score will be assessed.  Patient had CT and labs to workup for possible stroke. Alcohol negative. Labs grossly reassuring. CT head showed no acute valgus with her was chronic microvascular disease.   Neurology will be called  for the esperssive aphasia. Anticipate admission for stroke rule out and possible alcohol withdrawal seizures.  Neurology was called and they feel this is more likely a postictal phenomenon causing the speech abnormality in a setting of her alcohol withdrawal seizures. They are recommending CTA of the head and neck. They're also recommending admission and transferred to [facility] for MRI of the brain and EEG monitoring. They will see the patient on her arrival but are requesting her admission to the hosptalist service given the alcohol withdrawal.   Final Clinical Impressions(s) / ED Diagnoses   Final diagnoses:  Seizure ([place])  Expressive aphasia  Alcohol withdrawal syndrome with complication (HCC)    Clinical Impression: 1. Seizure ([place])   2.  Expressive aphasia   3. Alcohol withdrawal syndrome with complication [facility])     Disposition: Admit to Hospitalist service    Tegeler, Gwenyth Allegra, MD 11/08/16 [phone]

## 2016-11-08 ENCOUNTER — Inpatient Hospital Stay (HOSPITAL_COMMUNITY): Payer: Self-pay

## 2016-11-08 ENCOUNTER — Emergency Department (HOSPITAL_COMMUNITY): Payer: Self-pay

## 2016-11-08 ENCOUNTER — Encounter (HOSPITAL_COMMUNITY): Payer: Self-pay | Admitting: Internal Medicine

## 2016-11-08 DIAGNOSIS — R569 Unspecified convulsions: Secondary | ICD-10-CM

## 2016-11-08 DIAGNOSIS — R4701 Aphasia: Secondary | ICD-10-CM | POA: Diagnosis present

## 2016-11-08 DIAGNOSIS — F10239 Alcohol dependence with withdrawal, unspecified: Secondary | ICD-10-CM | POA: Diagnosis present

## 2016-11-08 DIAGNOSIS — G459 Transient cerebral ischemic attack, unspecified: Secondary | ICD-10-CM | POA: Insufficient documentation

## 2016-11-08 DIAGNOSIS — F1010 Alcohol abuse, uncomplicated: Secondary | ICD-10-CM | POA: Diagnosis present

## 2016-11-08 DIAGNOSIS — N838 Other noninflammatory disorders of ovary, fallopian tube and broad ligament: Secondary | ICD-10-CM | POA: Diagnosis present

## 2016-11-08 DIAGNOSIS — I361 Nonrheumatic tricuspid (valve) insufficiency: Secondary | ICD-10-CM

## 2016-11-08 DIAGNOSIS — F10939 Alcohol use, unspecified with withdrawal, unspecified: Secondary | ICD-10-CM | POA: Diagnosis present

## 2016-11-08 LAB — MRSA PCR SCREENING: MRSA BY PCR: NEGATIVE

## 2016-11-08 LAB — LIPID PANEL
CHOL/HDL RATIO: 2.9 ratio
Cholesterol: 180 mg/dL (ref 0–200)
HDL: 62 mg/dL (ref >40)
LDL CALC: 99 mg/dL (ref 0–99)
TRIGLYCERIDES: 96 mg/dL (ref <150)
VLDL: 19 mg/dL (ref 0–40)

## 2016-11-08 LAB — PHOSPHORUS: Phosphorus: 2.3 mg/dL — ABNORMAL LOW (ref 2.5–4.6)

## 2016-11-08 LAB — MAGNESIUM: Magnesium: 2.3 mg/dL (ref 1.7–2.4)

## 2016-11-08 LAB — HIV ANTIBODY (ROUTINE TESTING W REFLEX): HIV Screen 4th Generation wRfx: NONREACTIVE

## 2016-11-08 LAB — TROPONIN I: Troponin I: <0.03 ng/mL (ref <0.03)

## 2016-11-08 LAB — HEMOGLOBIN A1C
Hgb A1c MFr Bld: 5.3 % (ref 4.8–5.6)
Mean Plasma Glucose: 105.41 mg/dL

## 2016-11-08 LAB — ECHOCARDIOGRAM COMPLETE
Height: 65 in
WEIGHTICAEL: 2398.6 [oz_av]

## 2016-11-08 MED ORDER — ONDANSETRON 4 MG PO TBDP
4.0000 mg | ORAL_TABLET | Freq: Four times a day (QID) | ORAL | Status: DC | PRN
Start: 2016-11-08 — End: 2016-11-10

## 2016-11-08 MED ORDER — IOPAMIDOL (ISOVUE-370) INJECTION 76%
100.0000 mL | Freq: Once | INTRAVENOUS | Status: AC | PRN
  Administered 2016-11-08: 100 mL via INTRAVENOUS

## 2016-11-08 MED ORDER — ACETAMINOPHEN 650 MG RE SUPP: 650.0000 mg | RECTAL | Status: DC | PRN

## 2016-11-08 MED ORDER — IOPAMIDOL (ISOVUE-370) INJECTION 76%
INTRAVENOUS | Status: AC
  Administered 2016-11-08: 100 mL via INTRAVENOUS
  Filled 2016-11-08: qty 100

## 2016-11-08 MED ORDER — LORATADINE 10 MG PO TABS: 10.0000 mg | ORAL_TABLET | Freq: Every day | ORAL | Status: DC | PRN

## 2016-11-08 MED ORDER — LORAZEPAM 1 MG PO TABS: 1.0000 mg | ORAL_TABLET | Freq: Every day | ORAL | Status: DC

## 2016-11-08 MED ORDER — LORAZEPAM 1 MG PO TABS
1.0000 mg | ORAL_TABLET | Freq: Four times a day (QID) | ORAL | Status: AC
  Administered 2016-11-08 – 2016-11-09 (×3): 1 mg via ORAL
  Filled 2016-11-08 (×4): qty 1

## 2016-11-08 MED ORDER — NICOTINE 21 MG/24HR TD PT24
21.0000 mg | MEDICATED_PATCH | Freq: Every day | TRANSDERMAL | Status: DC
  Administered 2016-11-08 – 2016-11-10 (×3): 21 mg via TRANSDERMAL
  Filled 2016-11-08 (×3): qty 1

## 2016-11-08 MED ORDER — HYDROXYZINE HCL 25 MG PO TABS: 25.0000 mg | ORAL_TABLET | Freq: Four times a day (QID) | ORAL | Status: DC | PRN

## 2016-11-08 MED ORDER — ACETAMINOPHEN 325 MG PO TABS: 650.0000 mg | ORAL_TABLET | ORAL | Status: DC | PRN

## 2016-11-08 MED ORDER — LORAZEPAM 1 MG PO TABS
1.0000 mg | ORAL_TABLET | Freq: Three times a day (TID) | ORAL | Status: AC
  Administered 2016-11-09 – 2016-11-10 (×3): 1 mg via ORAL
  Filled 2016-11-08 (×3): qty 1

## 2016-11-08 MED ORDER — LOPERAMIDE HCL 2 MG PO CAPS: 2.0000 mg | ORAL_CAPSULE | ORAL | Status: DC | PRN

## 2016-11-08 MED ORDER — LORAZEPAM 1 MG PO TABS: 1.0000 mg | ORAL_TABLET | Freq: Two times a day (BID) | ORAL | Status: DC

## 2016-11-08 MED ORDER — LORAZEPAM 2 MG/ML IJ SOLN
2.0000 mg | INTRAMUSCULAR | Status: DC | PRN
  Administered 2016-11-08: 2 mg via INTRAVENOUS
  Filled 2016-11-08: qty 1

## 2016-11-08 MED ORDER — ACETAMINOPHEN 160 MG/5ML PO SOLN: 650.0000 mg | ORAL | Status: DC | PRN

## 2016-11-08 MED ORDER — STROKE: EARLY STAGES OF RECOVERY BOOK
Freq: Once | Status: AC
  Administered 2016-11-08: 06:00:00
  Filled 2016-11-08: qty 1

## 2016-11-08 MED ORDER — SODIUM CHLORIDE 0.9 % IV SOLN
INTRAVENOUS | Status: DC
  Administered 2016-11-08 – 2016-11-10 (×2): via INTRAVENOUS

## 2016-11-08 MED ORDER — SENNOSIDES-DOCUSATE SODIUM 8.6-50 MG PO TABS: 1.0000 | ORAL_TABLET | Freq: Every evening | ORAL | Status: DC | PRN

## 2016-11-08 NOTE — Procedures (Signed)
ELECTROENCEPHALOGRAM REPORT  Date of Study: 11/08/2016  Patient's Name: Yvette Barber MRN: [id number] Date of Birth: Dec 14, 1955  Referring Provider: Dr. Kerney Elbe  Clinical History: This is a 61 year old woman with seizure-like activity.  Medications: acetaminophen (TYLENOL) tablet 650 mg  loratadine (CLARITIN) tablet 10 mg  thiamine (B-1) injection 100 mg  thiamine (VITAMIN B-1) tablet 100 mg   Technical Summary: A multichannel digital EEG recording measured by the international 10-20 system with electrodes applied with paste and impedances below 5000 ohms performed in our laboratory with EKG monitoring in an awake and asleep patient.  Hyperventilation was not performed. Photic stimulation was performed.  The digital EEG was referentially recorded, reformatted, and digitally filtered in a variety of bipolar and referential montages for optimal display.    Description: The patient is awake and asleep during the recording.  During maximal wakefulness, there is a symmetric, medium voltage 10 Hz posterior dominant rhythm that attenuates with eye opening.  The record is symmetric.  There is an excess amount of diffuse low voltage beta activity seen throughout the recording. During drowsiness and sleep, there is an increase in theta slowing of the background, at times sharply contoured without clear epileptogenic potential. Positive occipital sharp transients of sleep (POSTS), vertex waves and symmetric sleep spindles were seen.  Photic stimulation did not elicit any abnormalities.  There were no epileptiform discharges or electrographic seizures seen.    EKG lead was unremarkable.  Impression: This awake and asleep EEG is normal except for excess amount of diffuse low voltage beta activity.  Clinical Correlation: Diffuse low voltage beta activity is commonly seen with sedating medications such as benzodiazepines.  In the absence of sedating medications, anxiety and hyperthyroidism may  produce generalized beta activity.  The absence of epileptiform discharges does not exclude a clinical diagnosis of epilepsy. Clinical correlation is advised.   Ellouise Newer, M.D.

## 2016-11-08 NOTE — Evaluation (Signed)
Occupational Therapy Evaluation Patient Details Name: Yvette Barber MRN: [id number] DOB: 1955-11-09 Today's Date: 11/08/2016    History of Present Illness Pt is a 61 y.o. female who was admitted following alcohol related withdrawal seizures. Pt reported drinking 12 beers a day but attempting to stop for the past 3 days. She developed seizure-like activity and friends called EMS. Episodes described as "freezing" with hands shaking. Unable to speak during episodes. Pt additionally reporting nausea/vomiting 11/07/16. PMH significant for alcoholism and asthma.    Clinical Impression   PTA, pt reports independence with ADL and functional mobility. Pt very lethargic at times and fluctuating between flat affect and agitation this session. She was able to complete basic ADL with overall supervision and VC's for initiation and continuation. She presents with decreased awareness, decreased orientation, and poor attention to task impacting her ability to participate. Feel pt will likely progress well with acute OT services and do not anticipate need for OT follow-up post-acute D/C. OT will continue to follow while admitted and update D/C recommendations as necessary.    Follow Up Recommendations  No OT follow up;Supervision/Assistance - 24 hour    Equipment Recommendations  Other (comment) (TBD)    Recommendations for Other Services       Precautions / Restrictions Precautions Precautions: Fall Precaution Comments: Seizures Restrictions Weight Bearing Restrictions: No      Mobility Bed Mobility Overal bed mobility: Needs Assistance Bed Mobility: Supine to Sit;Sit to Supine     Supine to sit: Supervision Sit to supine: Modified independent (Device/Increase time)   General bed mobility comments: VC's to initiate bed mobility to EOB.   Transfers Overall transfer level: Needs assistance Equipment used: None Transfers: Sit to/from Stand Sit to Stand: Supervision               Balance Overall balance assessment: No apparent balance deficits (not formally assessed)                                         ADL either performed or assessed with clinical judgement   ADL Overall ADL's : Needs assistance/impaired     Grooming: Supervision/safety;Sitting   Upper Body Bathing: Supervision/ safety;Sitting   Lower Body Bathing: Supervison/ safety;Sit to/from stand   Upper Body Dressing : Supervision/safety;Sitting   Lower Body Dressing: Supervision/safety;Sit to/from stand   Toilet Transfer: Copy Details (indicate cue type and reason): Able to complete sit<>stand at EOB but declined further mobility becoming agitated.            General ADL Comments: Pt requiring VC's to initiate and continue with activity this session. Able to complete LB dressing task to don/doff socks but requiring VC's to progress with task. Pt becoming agitated with therapist reporting "I am just trying to sleep."     Vision   Additional Comments: Need to continue to assess. Pt closing her eyes during session.     Perception     Praxis      Pertinent Vitals/Pain Pain Assessment: No/denies pain     Hand Dominance     Extremity/Trunk Assessment Upper Extremity Assessment Upper Extremity Assessment: Overall WFL for tasks assessed   Lower Extremity Assessment Lower Extremity Assessment: Defer to PT evaluation       Communication Communication Communication:  (answering questions with minimal answers)   Cognition Arousal/Alertness: Lethargic Behavior During Therapy: Agitated;Flat affect (initially flat and lethargic; becoming agitated  toward end ) Overall Cognitive Status: Impaired/Different from baseline Area of Impairment: Orientation;Attention;Following commands;Safety/judgement;Awareness                 Orientation Level: Disoriented to;Place;Time;Situation Current Attention Level: Sustained   Following  Commands: Follows one step commands inconsistently Safety/Judgement: Decreased awareness of safety;Decreased awareness of deficits Awareness: Intellectual   General Comments: Pt unable to report where she was despite options given. Reporting that the date is "episode" and then when asked to clarify reported the "18th."   General Comments  VSS througout session    Exercises     Shoulder Instructions      Home Living Family/patient expects to be discharged to:: Private residence Living Arrangements: Non-relatives/Friends Available Help at Discharge:  (pt reports roommate is not present at all times) Type of Home: House Home Access: Stairs to enter [employer] of Steps: 4 Entrance Stairs-Rails: Right;Left;Can reach both                     Additional Comments: Pt only able to report the above information. Pt very lethargic on evaluation and need to clarify this information. Did not answer bathroom set-up questions despite increased time and encouragement.       Prior Functioning/Environment          Comments: Pt reports independence.         OT Problem List: Decreased activity tolerance;Impaired balance (sitting and/or standing);Decreased cognition;Decreased safety awareness;Decreased knowledge of use of DME or AE;Decreased knowledge of precautions      OT Treatment/Interventions: Self-care/ADL training;Therapeutic exercise;Energy conservation;DME and/or AE instruction;Therapeutic activities;Cognitive remediation/compensation;Patient/family education;Balance training    OT Goals(Current goals can be found in the care plan section) Acute Rehab OT Goals Patient Stated Goal: go to sleep OT Goal Formulation: With patient Time For Goal Achievement: 11/22/16 Potential to Achieve Goals: Good ADL Goals Pt Will Perform Grooming: with modified independence;standing Pt Will Perform Upper Body Dressing: with modified independence;sitting Pt Will Perform Lower Body  Dressing: with modified independence;sit to/from stand Pt Will Transfer to Toilet: with modified independence;ambulating;regular height toilet Pt Will Perform Toileting - Clothing Manipulation and hygiene: with modified independence;sit to/from stand Pt Will Perform Tub/Shower Transfer: Tub transfer;Shower transfer;with modified independence;ambulating;shower seat;3 in 1 Additional ADL Goal #1: Pt will demonstrate selective attention during seated ADL tasks.  Additional ADL Goal #2: Pt will demonstrate emergent awareness during standing grooming tasks.   OT Frequency: Min 2X/week   Barriers to D/C:            Co-evaluation              AM-PAC PT "6 Clicks" Daily Activity     Outcome Measure Help from another person eating meals?: A Little Help from another person taking care of personal grooming?: A Little Help from another person toileting, which includes using toliet, bedpan, or urinal?: A Little Help from another person bathing (including washing, rinsing, drying)?: A Little Help from another person to put on and taking off regular upper body clothing?: A Little Help from another person to put on and taking off regular lower body clothing?: A Little 6 Click Score: 18   End of Session Nurse Communication: Mobility status  Activity Tolerance: Patient tolerated treatment well Patient left: in bed;with call bell/phone within reach;with bed alarm set  OT Visit Diagnosis: Other abnormalities of gait and mobility (R26.89);Other symptoms and signs involving cognitive function                Time: [phone]  OT Time Calculation (min): 15 min Charges:  OT General Charges $OT Visit: 1 Visit OT Evaluation $OT Eval Moderate Complexity: 1 Mod G-Codes:     Norman Herrlich, MS OTR/L  Pager: [place] A Hadyn Azer 11/08/2016, 9:34 AM

## 2016-11-08 NOTE — Consult Note (Addendum)
NEURO HOSPITALIST CONSULT NOTE   Requestig physician: Dr. Lonny Prude  Reason for Consult: Ethanol withdrawal seizures and intermittent dysphasia  History obtained from:  Patient and Chart     HPI:                                                                                                                                          Yvette Barber is an 61 y.o. female with a PMHx of ethanol abuse, history of EtOH withdrawal seizure and DTs 2 years ago who presented to the [facility] ED with seizure-like activity. She reportedly drinks 12 beers per day but discontinued alcohol 3 days prior to presentation. Per her friends, she developed seizure-like activity. Episodes described as "freezing" with hands shaking. She is mute during the episodes. She endorses having had N/V on Monday.    Past Medical History:  Diagnosis Date  . Alcoholism ([place])   . Asthma     Past Surgical History:  Procedure Laterality Date  . ABDOMINAL HYSTERECTOMY    . BREAST SURGERY    . PLACEMENT OF BREAST IMPLANTS      Family History  Problem Relation Age of Onset  . Alcohol abuse Father   . CAD Other   . Dementia Mother   . Diabetes Neg Hx    Social History:  reports that she has been smoking Cigarettes.  She has been smoking about 1.00 pack per day. She has never used smokeless tobacco. She reports that she drinks alcohol. She reports that she does not use drugs.  No Known Allergies  MEDICATIONS:                                                                                                                     Prior to Admission:  Prescriptions Prior to Admission  Medication Sig Dispense Refill Last Dose  . loratadine (CLARITIN) 10 MG tablet Take 10 mg by mouth daily as needed for allergies.   Past Week at Unknown time  . Multiple Vitamins-Minerals (MULTIVITAMIN ADULT) TABS Take 1 tablet by mouth daily.   Past Week at Unknown time   Scheduled: . LORazepam  0-4 mg Intravenous Q6H   Or  .  LORazepam  0-4 mg Oral Q6H  . [START ON 11/10/2016] LORazepam  0-4 mg  Intravenous Q12H   Or  . [START ON 11/10/2016] LORazepam  0-4 mg Oral Q12H  . thiamine  100 mg Oral Daily   Or  . thiamine  100 mg Intravenous Daily     ROS:                                                                                                                                       As per HPI.    Blood pressure 132/65, pulse 75, temperature 98.2 F (36.8 C), temperature source Oral, resp. rate 13, height 5\' 5"  (1.651 m), weight 68 kg (149 lb 14.6 oz), SpO2 96 %.   General Examination:                                                                                                      HEENT-  [place]/AT  Lungs: Respirations unlabored Extremities- No edema  Neurological Examination Mental Status: Mildly drowsy. Concentration is fair. Speech fluent with intact comprehension and naming. Some tangentiality noted. Oriented to "[place]"/state/year/month.   Cranial Nerves: II: Visual fields intact. PERRL.  III,IV, VI: EOMI without nystagmus.  V,VII: smile symmetric, facial temp sensation normal bilaterally VIII: hearing intact to voice IX,X: Mild hoarseness noted XI: Symmetric XII: midline tongue extension Motor: Right : Upper extremity   5/5    Left:     Upper extremity   5/5  Lower extremity   5/5     Lower extremity   5/5 Sensory: Temp and light touch intact throughout, bilaterally Deep Tendon Reflexes: 2+ and symmetric throughout Cerebellar: No ataxia with FNF bilaterally Gait: Deferred due to falls risk concerns   Lab Results: Basic Metabolic Panel:  Recent Labs Lab 11/07/16 2155 11/07/16 2216 11/08/16 0524  NA 137 136  --   K 3.9 4.1  --   CL 96* 97*  --   CO2 26  --   --   GLUCOSE 97 93  --   BUN 12 12  --   CREATININE 0.70 0.60  --   CALCIUM 9.0  --   --   MG  --   --  2.3  PHOS  --   --  2.3*    Liver Function Tests:  Recent Labs Lab 11/07/16 2155  AST 23  ALT 18  ALKPHOS  73  BILITOT 0.9  PROT 8.0  ALBUMIN 4.3   No results for input(s): LIPASE, AMYLASE in the last 168 hours. No results for input(s): AMMONIA in the last  168 hours.  CBC:  Recent Labs Lab 11/07/16 2155 11/07/16 2216  WBC 10.2  --   NEUTROABS 8.4*  --   HGB 13.9 14.3  HCT 40.5 42.0  MCV 91.2  --   PLT 277  --     Cardiac Enzymes:  Recent Labs Lab 11/08/16 0524  TROPONINI <0.03    Lipid Panel:  Recent Labs Lab 11/08/16 0524  CHOL 180  TRIG 96  HDL 62  CHOLHDL 2.9  VLDL 19  LDLCALC 99    CBG: No results for input(s): GLUCAP in the last 168 hours.  Microbiology: Results for orders placed or performed during the hospital encounter of 11/07/16  MRSA PCR Screening     Status: None   Collection Time: 11/08/16  4:58 AM  Result Value Ref Range Status   MRSA by PCR NEGATIVE NEGATIVE Final    Comment:        The GeneXpert MRSA Assay (FDA approved for NASAL specimens only), is one component of a comprehensive MRSA colonization surveillance program. It is not intended to diagnose MRSA infection nor to guide or monitor treatment for MRSA infections.     Coagulation Studies:  Recent Labs  11/07/16 2155  LABPROT 12.3  INR 0.93    Imaging: Ct Abdomen Pelvis Wo Contrast  Result Date: 11/08/2016 CLINICAL DATA:  Acute onset of generalized abdominal distention and abdominal pain. Initial encounter. EXAM: CT ABDOMEN AND PELVIS WITHOUT CONTRAST TECHNIQUE: Multidetector CT imaging of the abdomen and pelvis was performed following the standard protocol without IV contrast. COMPARISON:  None. FINDINGS: Lower chest: Minimal left basilar scarring is noted. Mild coronary artery calcifications are seen. There is dense calcification about the patient's bilateral breast implants. Hepatobiliary: The liver is unremarkable in appearance. The gallbladder is unremarkable in appearance. The common bile duct remains normal in caliber. Pancreas: The pancreas is within normal limits.  Spleen: A tiny hypodensity within the spleen is nonspecific. The spleen is otherwise unremarkable. Adrenals/Urinary Tract: The adrenal glands are unremarkable in appearance. A small left renal cyst is noted. The kidneys are difficult to fully assess due to contrast in the renal calyces and ureters. No obstructing ureteral stones are identified. No perinephric stranding is seen. There is no evidence of hydronephrosis. Stomach/Bowel: The stomach is unremarkable in appearance. The small bowel is within normal limits. The appendix is not well characterized; there is no evidence for appendicitis. Mild diverticulosis is noted at the mid sigmoid colon, without evidence of diverticulitis. Vascular/Lymphatic: Scattered calcification is seen along the abdominal aorta and its branches. The abdominal aorta is otherwise grossly unremarkable. The inferior vena cava is grossly unremarkable. No retroperitoneal lymphadenopathy is seen. No pelvic sidewall lymphadenopathy is identified. Reproductive: There is a very large complex cystic mass occupying the lower abdomen and pelvis, measuring approximately 21.4 x 21.3 x 9.8 cm. 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 A small amount of associated free fluid is noted within the pelvis. The patient is status post hysterectomy. The bladder is largely decompressed and grossly unremarkable, with a small amount of contrast noted in the bladder. Other: No additional soft tissue abnormalities are seen. Musculoskeletal: No acute osseous abnormalities are identified. Endplate sclerotic change is noted at L4-L5, with vacuum phenomenon at the lower lumbar spine. The visualized musculature is unremarkable in appearance. IMPRESSION: 1. Very large complex cystic mass occupying the lower abdomen and pelvis, measuring approximately 21.4 x 21.3 x 9.8  cm. 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 Small amount of associated free fluid within the pelvis. 2. Scattered aortic atherosclerosis. 3. Mild coronary artery calcification. 4. Mild diverticulosis at the mid sigmoid colon, without evidence of diverticulitis. 5. Small left renal cyst. These results were called by telephone at the time of interpretation on 11/08/2016 at 2:37 am to Dr. Roxanne Mins, who verbally acknowledged these results. Electronically Signed   By: Garald Balding M.D.   On: 11/08/2016 02:01   Ct Angio Head W Or Wo Contrast  Result Date: 11/08/2016 CLINICAL DATA:  Seizures EXAM: CT ANGIOGRAPHY HEAD AND NECK TECHNIQUE: Multidetector CT imaging of the head and neck was performed using the standard protocol during bolus administration of intravenous contrast. Multiplanar CT image reconstructions and MIPs were obtained to evaluate the vascular anatomy. Carotid stenosis measurements (when applicable) are obtained utilizing NASCET criteria, using the distal internal carotid diameter as the denominator. CONTRAST:  100 mL Isovue 370 COMPARISON:  Head CT 11/07/2016 FINDINGS: CTA NECK FINDINGS Aortic arch: There is no aneurysm or dissection of the visualized ascending aorta or aortic arch. Normal 3 vessel aortic branching pattern. The visualized proximal subclavian arteries are normal. Mild calcification of the aortic arch. Right carotid system: The right common carotid origin is widely patent. There is no common carotid or internal carotid artery dissection or aneurysm. Mixed calcified and noncalcified plaque at the right carotid bifurcation without associated hemodynamically significant stenosis. Left carotid system: The left common carotid origin is widely patent. There is no common carotid or internal carotid artery dissection or aneurysm. There is noncalcified plaque within the left common carotid artery causing less than 50% stenosis. There is mixed calcified  and noncalcified plaque at the carotid bifurcation extending into the proximal left ICA, causing no hemodynamically significant stenosis. Vertebral arteries: The vertebral system is codominant. Both vertebral artery origins are normal. Both vertebral arteries are normal to their confluence with the basilar artery. Skeleton: There is no bony spinal canal stenosis. No lytic or blastic lesions. Other neck: The nasopharynx is clear. The oropharynx and hypopharynx are normal. The epiglottis is normal. The supraglottic larynx, glottis and subglottic larynx are normal. No retropharyngeal collection. The parapharyngeal spaces are preserved. The parotid and submandibular glands are normal. No sialolithiasis or salivary ductal dilatation. The thyroid gland is normal. There is no cervical lymphadenopathy. Upper chest: No pneumothorax or pleural effusion. No nodules or masses. Review of the MIP images confirms the above findings CTA HEAD FINDINGS Anterior circulation: --Intracranial internal carotid arteries: Multifocal atherosclerotic disease of both internal carotid arteries at the skullbase without flow-limiting stenosis. --Anterior cerebral arteries: Normal. --Middle cerebral arteries: Normal. --Posterior communicating arteries: Absent bilaterally. Posterior circulation: --Posterior cerebral arteries: Normal. --Superior cerebellar arteries: Normal. --Basilar artery: Normal. --Anterior inferior cerebellar arteries: Normal. --Posterior inferior cerebellar arteries: Normal. Venous sinuses: As permitted by contrast timing, patent. Anatomic variants: None Delayed phase: No parenchymal contrast enhancement. Review of the MIP images confirms the above findings IMPRESSION: 1. No emergent large vessel occlusion or high-grade intracranial stenosis. 2. Bilateral mixed calcified and noncalcified atherosclerosis of the common carotid, cervical internal carotid and proximal intracranial internal carotid arteries. No hemodynamically  significant stenosis. 3.  Aortic Atherosclerosis (ICD10-I70.0). Electronically Signed   By: Ulyses Jarred M.D.   On: 11/08/2016 01:33   Ct Head Wo Contrast  Result Date: 11/07/2016 CLINICAL DATA:  Seizure streak alcohol withdrawal. EXAM: CT HEAD WITHOUT CONTRAST TECHNIQUE: Contiguous  axial images were obtained from the base of the skull through the vertex without intravenous contrast. COMPARISON:  None. FINDINGS: Brain: No evidence of acute infarction, hemorrhage, hydrocephalus, extra-axial collection or mass lesion/mass effect. Mild brain parenchymal volume loss and periventricular microangiopathy. Vascular: Calcific atherosclerotic disease at the skullbase. Skull: Normal. Negative for fracture or focal lesion. Sinuses/Orbits: No acute finding. Other: None. IMPRESSION: No acute intracranial abnormality. Atrophy, chronic microvascular disease. Electronically Signed   By: Fidela Salisbury M.D.   On: 11/07/2016 22:06   Ct Angio Neck W Or Wo Contrast  Result Date: 11/08/2016 CLINICAL DATA:  Seizures EXAM: CT ANGIOGRAPHY HEAD AND NECK TECHNIQUE: Multidetector CT imaging of the head and neck was performed using the standard protocol during bolus administration of intravenous contrast. Multiplanar CT image reconstructions and MIPs were obtained to evaluate the vascular anatomy. Carotid stenosis measurements (when applicable) are obtained utilizing NASCET criteria, using the distal internal carotid diameter as the denominator. CONTRAST:  100 mL Isovue 370 COMPARISON:  Head CT 11/07/2016 FINDINGS: CTA NECK FINDINGS Aortic arch: There is no aneurysm or dissection of the visualized ascending aorta or aortic arch. Normal 3 vessel aortic branching pattern. The visualized proximal subclavian arteries are normal. Mild calcification of the aortic arch. Right carotid system: The right common carotid origin is widely patent. There is no common carotid or internal carotid artery dissection or aneurysm. Mixed calcified and  noncalcified plaque at the right carotid bifurcation without associated hemodynamically significant stenosis. Left carotid system: The left common carotid origin is widely patent. There is no common carotid or internal carotid artery dissection or aneurysm. There is noncalcified plaque within the left common carotid artery causing less than 50% stenosis. There is mixed calcified and noncalcified plaque at the carotid bifurcation extending into the proximal left ICA, causing no hemodynamically significant stenosis. Vertebral arteries: The vertebral system is codominant. Both vertebral artery origins are normal. Both vertebral arteries are normal to their confluence with the basilar artery. Skeleton: There is no bony spinal canal stenosis. No lytic or blastic lesions. Other neck: The nasopharynx is clear. The oropharynx and hypopharynx are normal. The epiglottis is normal. The supraglottic larynx, glottis and subglottic larynx are normal. No retropharyngeal collection. The parapharyngeal spaces are preserved. The parotid and submandibular glands are normal. No sialolithiasis or salivary ductal dilatation. The thyroid gland is normal. There is no cervical lymphadenopathy. Upper chest: No pneumothorax or pleural effusion. No nodules or masses. Review of the MIP images confirms the above findings CTA HEAD FINDINGS Anterior circulation: --Intracranial internal carotid arteries: Multifocal atherosclerotic disease of both internal carotid arteries at the skullbase without flow-limiting stenosis. --Anterior cerebral arteries: Normal. --Middle cerebral arteries: Normal. --Posterior communicating arteries: Absent bilaterally. Posterior circulation: --Posterior cerebral arteries: Normal. --Superior cerebellar arteries: Normal. --Basilar artery: Normal. --Anterior inferior cerebellar arteries: Normal. --Posterior inferior cerebellar arteries: Normal. Venous sinuses: As permitted by contrast timing, patent. Anatomic variants:  None Delayed phase: No parenchymal contrast enhancement. Review of the MIP images confirms the above findings IMPRESSION: 1. No emergent large vessel occlusion or high-grade intracranial stenosis. 2. Bilateral mixed calcified and noncalcified atherosclerosis of the common carotid, cervical internal carotid and proximal intracranial internal carotid arteries. No hemodynamically significant stenosis. 3.  Aortic Atherosclerosis (ICD10-I70.0). Electronically Signed   By: Ulyses Jarred M.D.   On: 11/08/2016 01:33    Assessment: 61 year old female with EtOH withdrawal seizures 1. Neurological exam shows decreased attention and some tangentiality. No lateralizing findings noted.  2. CT head shows no acute intracranial abnormality. Atrophy  and chronic microvascular ischemic changes are noted. 3. CTA head and neck shows no emergent large vessel occlusion or high-grade intracranial stenosis. There is bilateral mixed calcified and noncalcified atherosclerosis of the common carotid, cervical internal carotid and proximal intracranial internal carotid arteries, without hemodynamically significant stenosis.  4. Episodic mutism. Most likely secondary to intermittent seizures and/or postictal state in the context of EtOH withdrawal.   Recommendations; 1. Agree with PRN Lorazepam administration. Consider changing to a scheduled benzodiazepine x 3 days, followed by PRN dosing.  2. Agree with thiamine administration 3. MRI brain.  4. EEG.  5. CIWA protocol.  6. Seizure precautions  Electronically signed: Dr. Kerney Elbe 11/08/2016, 7:57 AM

## 2016-11-08 NOTE — Progress Notes (Signed)
EEG Completed; Results Pending  

## 2016-11-08 NOTE — Progress Notes (Signed)
Pt states "They are trying to kill me with rat poison."  This RN asked patient to explain further she stated "the blonde one that walks around, do you see her now she is here in the corner."  Only this RN and patient in room.  CIWA score 11.  Pt medicated with ativan PRN per orders.

## 2016-11-08 NOTE — Progress Notes (Signed)
  Echocardiogram 2D Echocardiogram has been performed.  Merrie Roof F 11/08/2016, 12:56 PM

## 2016-11-08 NOTE — Evaluation (Addendum)
Clinical/Bedside Swallow Evaluation Patient Details  Name: Yvette Barber MRN: [id number] Date of Birth: November 26, 1955  Today's Date: 11/08/2016 Time: SLP Start Time (ACUTE ONLY): 68 SLP Stop Time (ACUTE ONLY): 0936 SLP Time Calculation (min) (ACUTE ONLY): 16 min  Past Medical History:  Past Medical History:  Diagnosis Date  . Alcoholism ([place])   . Asthma    Past Surgical History:  Past Surgical History:  Procedure Laterality Date  . ABDOMINAL HYSTERECTOMY    . BREAST SURGERY    . PLACEMENT OF BREAST IMPLANTS     HPI:  Yvette Barber a 61 y.o.femalewith medical history significant of alcohol abuse, hx of alcohol withdrawal seizure and DT's 2 yeas ago and asthma. Presented with seizure-like activity reported drinking 12 beers a day and stopped 3 days ago. Per chart reports trouble speaking for past 3 days with expressive aphasia that is intermittent. CT No acute intracranial abnormality. Atrophy, chronic microvascular disease. CXR not completed.    Assessment / Plan / Recommendation Clinical Impression  Pt unable to participate in 3 oz water assessment due to initially decreased alertness and awareness once woken. No indications of decreased airway compromise, decreased timing or residual. Functional oral phase. She may be at risk of esophageal impairments given significant ETOH abuse. Recommend regular texture, thin liquids, straws or cups, pills with thin. No further ST needed for swallow. Cognitive-linguistic assessment to be completed.  SLP Visit Diagnosis: Dysphagia, unspecified (R13.10)    Aspiration Risk  Mild aspiration risk    Diet Recommendation Regular;Thin liquid   Liquid Administration via: Cup;Straw Medication Administration: Whole meds with liquid Supervision: Patient able to self feed;Intermittent supervision to cue for compensatory strategies Compensations: Slow rate;Small sips/bites Postural Changes: Seated upright at 90 degrees    Other  Recommendations  Oral Care Recommendations: Oral care BID   Follow up Recommendations None      Frequency and Duration            Prognosis        Swallow Study   General Date of Onset: 11/07/16 HPI: Yvette Barber a 61 y.o.femalewith medical history significant of alcohol abuse, hx of alcohol withdrawal seizure and DT's 2 yeas ago and asthma. Presented with seizure-like activity reported drinking 12 beers a day and stopped 3 days ago. Per chart reports trouble speaking for past 3 days with expressive aphasia that is intermittent. CT No acute intracranial abnormality. Atrophy, chronic microvascular disease. CXR not completed.  Type of Study: Bedside Swallow Evaluation Previous Swallow Assessment:  (none) Diet Prior to this Study: NPO Temperature Spikes Noted: No Respiratory Status: Room air History of Recent Intubation: No Behavior/Cognition: Alert;Cooperative;Requires cueing Oral Cavity Assessment: Other (comment) (lingual candidias) Oral Care Completed by SLP: Yes Oral Cavity - Dentition: Adequate natural dentition (pt states dentures) Vision: Functional for self-feeding Self-Feeding Abilities: Able to feed self Patient Positioning: Upright in bed Baseline Vocal Quality: Normal Volitional Cough: Strong Volitional Swallow: Able to elicit    Oral/Motor/Sensory Function Overall Oral Motor/Sensory Function: Within functional limits   Ice Chips Ice chips: Not tested   Thin Liquid Thin Liquid: Within functional limits Presentation: Cup;Straw    Nectar Thick Nectar Thick Liquid: Not tested   Honey Thick Honey Thick Liquid: Not tested   Puree Puree: Within functional limits   Solid   GO   Solid: Within functional limits        Yvette Barber 11/08/2016,9:47 AM  Yvette Barber Yvette Barber.Ed [employer] [phone]

## 2016-11-08 NOTE — Progress Notes (Signed)
PROGRESS NOTE    Yvette Barber  DJS:[id number] DOB: Jul 21, 1955 DOA: 11/07/2016 PCP: Patient, No Pcp Per   Brief Narrative: Yvette Barber is a 61 y.o. female with medical history significant of alcohol abuse, hx of alcohol withdrawal seizure and DT's 2 yeas ago. She had a withdrawal seizure and aphasia concerning for TIA. MRI pending. EEG pending. Neurology consulted. Currently on ativan detox. Incidentally, found to have an ovarian mass.   Assessment & Plan:   Active Problems:   Alcohol abuse   Alcohol withdrawal seizure with complication, with unspecified complication (HCC)   Aphasia   TIA (transient ischemic attack)   Alcohol withdrawal (HCC)   Mass, ovarian   Seizure ([place])   Alcohol withdrawal seizure Alcohol withdrawal Alcohol abuse Mild withdrawal symptoms overnight. -continue Ativan detox -continue CIWA -neurology recommendations: EEG, MRI  Aphasia Symptoms resolved. Concern for TIA. -MRI as above  Ovarian mass Concern for cystadenoma vs cystadenocarcinoma -CA 125 pending -will consult gyn onc while inpatient -patient to find out who her gynecologist is for follow-up. Will need follow-up as an outpatient.   DVT prophylaxis: SCDs Code Status: Full code Family Communication: None at bedside Disposition Plan: Discharge pending workup   Consultants:   Neurology  Gynecology oncology  Procedures:   None  Antimicrobials:  None    Subjective: No concerns today. No abdominal pain. No seizures overnight.  Objective: Vitals:   11/08/16 0455 11/08/16 0520 11/08/16 0750 11/08/16 1115  BP: 124/76  132/65 103/88  Pulse: 75   94  Resp: 15  13 (!) 23  Temp: 97.9 F (36.6 C)  98.2 F (36.8 C) 98.3 F (36.8 C)  TempSrc: Oral  Oral Oral  SpO2: 99%  96% 99%  Weight:  68 kg (149 lb 14.6 oz)    Height:  5\' 5"  (1.651 m)      Intake/Output Summary (Last 24 hours) at 11/08/16 1257 Last data filed at 11/08/16 0600  Gross per 24 hour  Intake            120.83 ml  Output                0 ml  Net           120.83 ml   Filed Weights   11/07/16 2011 11/08/16 0520  Weight: 68 kg (150 lb) 68 kg (149 lb 14.6 oz)    Examination:  General exam: Appears calm and comfortable  Respiratory system: Clear to auscultation. Respiratory effort normal. Cardiovascular system: S1 & S2 heard, RRR. No murmurs, rubs, gallops or clicks. Gastrointestinal system: Abdomen is nondistended, soft and nontender. Normal bowel sounds heard. Central nervous system: Alert and oriented. No focal neurological deficits. No tremor or asterixis. Extremities: No edema. No calf tenderness Skin: No cyanosis. No rashes Psychiatry: Judgement and insight appear normal. Mood & affect appropriate.     Data Reviewed: I have personally reviewed following labs and imaging studies  CBC:  Recent Labs Lab 11/07/16 2155 11/07/16 2216  WBC 10.2  --   NEUTROABS 8.4*  --   HGB 13.9 14.3  HCT 40.5 42.0  MCV 91.2  --   PLT 277  --    Basic Metabolic Panel:  Recent Labs Lab 11/07/16 2155 11/07/16 2216 11/08/16 0524  NA 137 136  --   K 3.9 4.1  --   CL 96* 97*  --   CO2 26  --   --   GLUCOSE 97 93  --   BUN 12 12  --  CREATININE 0.70 0.60  --   CALCIUM 9.0  --   --   MG  --   --  2.3  PHOS  --   --  2.3*   GFR: Estimated Creatinine Clearance: 67.3 mL/min (by C-G formula based on SCr of 0.6 mg/dL). Liver Function Tests:  Recent Labs Lab 11/07/16 2155  AST 23  ALT 18  ALKPHOS 73  BILITOT 0.9  PROT 8.0  ALBUMIN 4.3   No results for input(s): LIPASE, AMYLASE in the last 168 hours. No results for input(s): AMMONIA in the last 168 hours. Coagulation Profile:  Recent Labs Lab 11/07/16 2155  INR 0.93   Cardiac Enzymes:  Recent Labs Lab 11/08/16 0524  TROPONINI <0.03   BNP (last 3 results) No results for input(s): PROBNP in the last 8760 hours. HbA1C:  Recent Labs  11/08/16 0524  HGBA1C 5.3   CBG: No results for input(s): GLUCAP in the  last 168 hours. Lipid Profile:  Recent Labs  11/08/16 0524  CHOL 180  HDL 62  LDLCALC 99  TRIG 96  CHOLHDL 2.9   Thyroid Function Tests: No results for input(s): TSH, T4TOTAL, FREET4, T3FREE, THYROIDAB in the last 72 hours. Anemia Panel: No results for input(s): VITAMINB12, FOLATE, FERRITIN, TIBC, IRON, RETICCTPCT in the last 72 hours. Sepsis Labs: No results for input(s): PROCALCITON, LATICACIDVEN in the last 168 hours.  Recent Results (from the past 240 hour(s))  MRSA PCR Screening     Status: None   Collection Time: 11/08/16  4:58 AM  Result Value Ref Range Status   MRSA by PCR NEGATIVE NEGATIVE Final    Comment:        The GeneXpert MRSA Assay (FDA approved for NASAL specimens only), is one component of a comprehensive MRSA colonization surveillance program. It is not intended to diagnose MRSA infection nor to guide or monitor treatment for MRSA infections.          Radiology Studies: Ct Abdomen Pelvis Wo Contrast  Result Date: 11/08/2016 CLINICAL DATA:  Acute onset of generalized abdominal distention and abdominal pain. Initial encounter. EXAM: CT ABDOMEN AND PELVIS WITHOUT CONTRAST TECHNIQUE: Multidetector CT imaging of the abdomen and pelvis was performed following the standard protocol without IV contrast. COMPARISON:  None. FINDINGS: Lower chest: Minimal left basilar scarring is noted. Mild coronary artery calcifications are seen. There is dense calcification about the patient's bilateral breast implants. Hepatobiliary: The liver is unremarkable in appearance. The gallbladder is unremarkable in appearance. The common bile duct remains normal in caliber. Pancreas: The pancreas is within normal limits. Spleen: A tiny hypodensity within the spleen is nonspecific. The spleen is otherwise unremarkable. Adrenals/Urinary Tract: The adrenal glands are unremarkable in appearance. A small left renal cyst is noted. The kidneys are difficult to fully assess due to contrast in  the renal calyces and ureters. No obstructing ureteral stones are identified. No perinephric stranding is seen. There is no evidence of hydronephrosis. Stomach/Bowel: The stomach is unremarkable in appearance. The small bowel is within normal limits. The appendix is not well characterized; there is no evidence for appendicitis. Mild diverticulosis is noted at the mid sigmoid colon, without evidence of diverticulitis. Vascular/Lymphatic: Scattered calcification is seen along the abdominal aorta and its branches. The abdominal aorta is otherwise grossly unremarkable. The inferior vena cava is grossly unremarkable. No retroperitoneal lymphadenopathy is seen. No pelvic sidewall lymphadenopathy is identified. Reproductive: There is a very large complex cystic mass occupying the lower abdomen and pelvis, measuring approximately 21.4 x 21.3 x  9.8 cm. 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 A small amount of associated free fluid is noted within the pelvis. The patient is status post hysterectomy. The bladder is largely decompressed and grossly unremarkable, with a small amount of contrast noted in the bladder. Other: No additional soft tissue abnormalities are seen. Musculoskeletal: No acute osseous abnormalities are identified. Endplate sclerotic change is noted at L4-L5, with vacuum phenomenon at the lower lumbar spine. The visualized musculature is unremarkable in appearance. IMPRESSION: 1. Very large complex cystic mass occupying the lower abdomen and pelvis, measuring approximately 21.4 x 21.3 x 9.8 cm. 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 Small amount of associated free fluid within the pelvis. 2. Scattered aortic atherosclerosis. 3. Mild coronary artery calcification. 4. Mild diverticulosis at the mid sigmoid  colon, without evidence of diverticulitis. 5. Small left renal cyst. These results were called by telephone at the time of interpretation on 11/08/2016 at 8:41 am to Dr. Roxanne Mins, who verbally acknowledged these results. Electronically Signed   By: Garald Balding M.D.   On: 11/08/2016 02:01   Ct Angio Head W Or Wo Contrast  Result Date: 11/08/2016 CLINICAL DATA:  Seizures EXAM: CT ANGIOGRAPHY HEAD AND NECK TECHNIQUE: Multidetector CT imaging of the head and neck was performed using the standard protocol during bolus administration of intravenous contrast. Multiplanar CT image reconstructions and MIPs were obtained to evaluate the vascular anatomy. Carotid stenosis measurements (when applicable) are obtained utilizing NASCET criteria, using the distal internal carotid diameter as the denominator. CONTRAST:  100 mL Isovue 370 COMPARISON:  Head CT 11/07/2016 FINDINGS: CTA NECK FINDINGS Aortic arch: There is no aneurysm or dissection of the visualized ascending aorta or aortic arch. Normal 3 vessel aortic branching pattern. The visualized proximal subclavian arteries are normal. Mild calcification of the aortic arch. Right carotid system: The right common carotid origin is widely patent. There is no common carotid or internal carotid artery dissection or aneurysm. Mixed calcified and noncalcified plaque at the right carotid bifurcation without associated hemodynamically significant stenosis. Left carotid system: The left common carotid origin is widely patent. There is no common carotid or internal carotid artery dissection or aneurysm. There is noncalcified plaque within the left common carotid artery causing less than 50% stenosis. There is mixed calcified and noncalcified plaque at the carotid bifurcation extending into the proximal left ICA, causing no hemodynamically significant stenosis. Vertebral arteries: The vertebral system is codominant. Both vertebral artery origins are normal. Both vertebral arteries are  normal to their confluence with the basilar artery. Skeleton: There is no bony spinal canal stenosis. No lytic or blastic lesions. Other neck: The nasopharynx is clear. The oropharynx and hypopharynx are normal. The epiglottis is normal. The supraglottic larynx, glottis and subglottic larynx are normal. No retropharyngeal collection. The parapharyngeal spaces are preserved. The parotid and submandibular glands are normal. No sialolithiasis or salivary ductal dilatation. The thyroid gland is normal. There is no cervical lymphadenopathy. Upper chest: No pneumothorax or pleural effusion. No nodules or masses. Review of the MIP images confirms the above findings CTA HEAD FINDINGS Anterior circulation: --Intracranial internal carotid arteries: Multifocal atherosclerotic disease of both internal carotid arteries at the skullbase without flow-limiting stenosis. --Anterior cerebral arteries: Normal. --Middle cerebral arteries: Normal. --Posterior communicating arteries: Absent bilaterally. Posterior circulation: --Posterior cerebral arteries: Normal. --Superior cerebellar arteries: Normal. --  Basilar artery: Normal. --Anterior inferior cerebellar arteries: Normal. --Posterior inferior cerebellar arteries: Normal. Venous sinuses: As permitted by contrast timing, patent. Anatomic variants: None Delayed phase: No parenchymal contrast enhancement. Review of the MIP images confirms the above findings IMPRESSION: 1. No emergent large vessel occlusion or high-grade intracranial stenosis. 2. Bilateral mixed calcified and noncalcified atherosclerosis of the common carotid, cervical internal carotid and proximal intracranial internal carotid arteries. No hemodynamically significant stenosis. 3.  Aortic Atherosclerosis (ICD10-I70.0). Electronically Signed   By: Ulyses Jarred M.D.   On: 11/08/2016 01:33   Ct Head Wo Contrast  Result Date: 11/07/2016 CLINICAL DATA:  Seizure streak alcohol withdrawal. EXAM: CT HEAD WITHOUT CONTRAST  TECHNIQUE: Contiguous axial images were obtained from the base of the skull through the vertex without intravenous contrast. COMPARISON:  None. FINDINGS: Brain: No evidence of acute infarction, hemorrhage, hydrocephalus, extra-axial collection or mass lesion/mass effect. Mild brain parenchymal volume loss and periventricular microangiopathy. Vascular: Calcific atherosclerotic disease at the skullbase. Skull: Normal. Negative for fracture or focal lesion. Sinuses/Orbits: No acute finding. Other: None. IMPRESSION: No acute intracranial abnormality. Atrophy, chronic microvascular disease. Electronically Signed   By: Fidela Salisbury M.D.   On: 11/07/2016 22:06   Ct Angio Neck W Or Wo Contrast  Result Date: 11/08/2016 CLINICAL DATA:  Seizures EXAM: CT ANGIOGRAPHY HEAD AND NECK TECHNIQUE: Multidetector CT imaging of the head and neck was performed using the standard protocol during bolus administration of intravenous contrast. Multiplanar CT image reconstructions and MIPs were obtained to evaluate the vascular anatomy. Carotid stenosis measurements (when applicable) are obtained utilizing NASCET criteria, using the distal internal carotid diameter as the denominator. CONTRAST:  100 mL Isovue 370 COMPARISON:  Head CT 11/07/2016 FINDINGS: CTA NECK FINDINGS Aortic arch: There is no aneurysm or dissection of the visualized ascending aorta or aortic arch. Normal 3 vessel aortic branching pattern. The visualized proximal subclavian arteries are normal. Mild calcification of the aortic arch. Right carotid system: The right common carotid origin is widely patent. There is no common carotid or internal carotid artery dissection or aneurysm. Mixed calcified and noncalcified plaque at the right carotid bifurcation without associated hemodynamically significant stenosis. Left carotid system: The left common carotid origin is widely patent. There is no common carotid or internal carotid artery dissection or aneurysm. There is  noncalcified plaque within the left common carotid artery causing less than 50% stenosis. There is mixed calcified and noncalcified plaque at the carotid bifurcation extending into the proximal left ICA, causing no hemodynamically significant stenosis. Vertebral arteries: The vertebral system is codominant. Both vertebral artery origins are normal. Both vertebral arteries are normal to their confluence with the basilar artery. Skeleton: There is no bony spinal canal stenosis. No lytic or blastic lesions. Other neck: The nasopharynx is clear. The oropharynx and hypopharynx are normal. The epiglottis is normal. The supraglottic larynx, glottis and subglottic larynx are normal. No retropharyngeal collection. The parapharyngeal spaces are preserved. The parotid and submandibular glands are normal. No sialolithiasis or salivary ductal dilatation. The thyroid gland is normal. There is no cervical lymphadenopathy. Upper chest: No pneumothorax or pleural effusion. No nodules or masses. Review of the MIP images confirms the above findings CTA HEAD FINDINGS Anterior circulation: --Intracranial internal carotid arteries: Multifocal atherosclerotic disease of both internal carotid arteries at the skullbase without flow-limiting stenosis. --Anterior cerebral arteries: Normal. --Middle cerebral arteries: Normal. --Posterior communicating arteries: Absent bilaterally. Posterior circulation: --Posterior cerebral arteries: Normal. --Superior cerebellar arteries: Normal. --Basilar artery: Normal. --Anterior inferior cerebellar arteries: Normal. --Posterior inferior cerebellar  arteries: Normal. Venous sinuses: As permitted by contrast timing, patent. Anatomic variants: None Delayed phase: No parenchymal contrast enhancement. Review of the MIP images confirms the above findings IMPRESSION: 1. No emergent large vessel occlusion or high-grade intracranial stenosis. 2. Bilateral mixed calcified and noncalcified atherosclerosis of the  common carotid, cervical internal carotid and proximal intracranial internal carotid arteries. No hemodynamically significant stenosis. 3.  Aortic Atherosclerosis (ICD10-I70.0). Electronically Signed   By: Ulyses Jarred M.D.   On: 11/08/2016 01:33        Scheduled Meds: . LORazepam  0-4 mg Intravenous Q6H   Or  . LORazepam  0-4 mg Oral Q6H  . [START ON 11/10/2016] LORazepam  0-4 mg Intravenous Q12H   Or  . [START ON 11/10/2016] LORazepam  0-4 mg Oral Q12H  . thiamine  100 mg Oral Daily   Or  . thiamine  100 mg Intravenous Daily   Continuous Infusions: . sodium chloride 125 mL/hr at 11/08/16 0502     LOS: 0 days     Cordelia Poche, MD [facility] 11/08/2016, 12:57 PM Pager: [phone]  If 7PM-7AM, please contact night-coverage [email or web address] Password [email or web address] 11/08/2016, 12:57 PM

## 2016-11-08 NOTE — Evaluation (Signed)
Physical Therapy Evaluation Patient Details Name: Yvette Barber MRN: [id number] DOB: [birth year] Today's Date: 11/08/2016   History of Present Illness  Pt is a 61 y.o. female who was admitted following alcohol related withdrawal seizures. Pt reported drinking 12 beers a day but attempting to stop for the past 3 days. She developed seizure-like activity and friends called EMS. Episodes described as "freezing" with hands shaking. Unable to speak during episodes. Pt additionally reporting nausea/vomiting 11/07/16. PMH significant for alcoholism and asthma.   Clinical Impression  Pt admitted with above diagnosis. Pt currently with functional limitations due to the deficits listed below (see PT Problem List). Pt is limited by decreased safety awareness and generalized weakness. Pt currently supervision for bed mobility and transfers and min guard for ambulation without AD. Pt will benefit from skilled PT to increase their independence and safety with mobility to allow discharge to the venue listed below.       Follow Up Recommendations No PT follow up;Supervision for mobility/OOB    Equipment Recommendations  None recommended by PT    Recommendations for Other Services       Precautions / Restrictions Precautions Precautions: Fall Precaution Comments: Seizures Restrictions Weight Bearing Restrictions: No      Mobility  Bed Mobility Overal bed mobility: Needs Assistance Bed Mobility: Supine to Sit;Sit to Supine     Supine to sit: Supervision Sit to supine: Modified independent (Device/Increase time)   General bed mobility comments: supervision for safety, vc for waiting until lines were arranged so she would not get tangled  Transfers Overall transfer level: Needs assistance Equipment used: Rolling walker (2 wheeled) Transfers: Sit to/from Stand Sit to Stand: Supervision         General transfer comment: supervision for safety, vc for pushing up to  RW  Ambulation/Gait Ambulation/Gait assistance: Min guard Ambulation Distance (Feet): 150 Feet (28 with RW and 100 without AD) Assistive device: None;Rolling walker (2 wheeled) Gait Pattern/deviations: Step-through pattern;Decreased step length - right;Decreased step length - left;Shuffle;Trunk flexed;Drifts right/left Gait velocity: slowed Gait velocity interpretation: Below normal speed for age/gender General Gait Details: min guard for safety, initially walked with RW but pt had decreased awareness of safe usage picking it up over obstacles and bumping into things, safer ambulation without AD still with some amount of drifting right and left         Balance Overall balance assessment: Needs assistance Sitting-balance support: Feet supported Sitting balance-Leahy Scale: Good     Standing balance support: No upper extremity supported;During functional activity Standing balance-Leahy Scale: Good                               Pertinent Vitals/Pain Pain Assessment: No/denies pain Faces Pain Scale: Hurts a little bit Pain Intervention(s): Monitored during session    Home Living Family/patient expects to be discharged to:: Private residence Living Arrangements: Non-relatives/Friends Available Help at Discharge:  (pt reports roommate is not present at all times) Type of Home: House Home Access: Stairs to enter Entrance Stairs-Rails: Right;Left;Can reach both Entrance Stairs-Number of Steps: 4 Home Layout: One level Home Equipment: None      Prior Function Level of Independence: Independent               Hand Dominance   Dominant Hand: Right    Extremity/Trunk Assessment   Upper Extremity Assessment Upper Extremity Assessment: Overall WFL for tasks assessed    Lower Extremity Assessment Lower Extremity Assessment:  Overall WFL for tasks assessed       Communication   Communication: No difficulties  Cognition Arousal/Alertness:  Awake/alert Behavior During Therapy: Agitated;Flat affect (initially flat and lethargic; becoming agitated toward end ) Overall Cognitive Status: Within Functional Limits for tasks assessed Area of Impairment: Orientation;Attention;Following commands;Safety/judgement;Awareness                 Orientation Level: Disoriented to;Time Current Attention Level: Selective   Following Commands: Follows one step commands inconsistently Safety/Judgement: Decreased awareness of safety;Decreased awareness of deficits Awareness: Intellectual   General Comments: Pt able to report where she is today and why she is here but does not know the day, very impulsive with decreased safety awareness      General Comments General comments (skin integrity, edema, etc.): VSS throughout session         Assessment/Plan    PT Assessment Patient needs continued PT services  PT Problem List Decreased activity tolerance;Decreased mobility;Decreased safety awareness;Decreased knowledge of precautions       PT Treatment Interventions Gait training;Stair training;Functional mobility training;Therapeutic activities;Therapeutic exercise;Balance training;Patient/family education    PT Goals (Current goals can be found in the Care Plan section)  Acute Rehab PT Goals Patient Stated Goal: go home PT Goal Formulation: With patient Time For Goal Achievement: 11/15/16 Potential to Achieve Goals: Good    Frequency Min 3X/week   Barriers to discharge Decreased caregiver support         AM-PAC PT "6 Clicks" Daily Activity  Outcome Measure Difficulty turning over in bed (including adjusting bedclothes, sheets and blankets)?: A Little Difficulty moving from lying on back to sitting on the side of the bed? : A Little Difficulty sitting down on and standing up from a chair with arms (e.g., wheelchair, bedside commode, etc,.)?: A Little Help needed moving to and from a bed to chair (including a wheelchair)?: A  Little Help needed walking in hospital room?: A Little Help needed climbing 3-5 steps with a railing? : A Lot 6 Click Score: 17    End of Session Equipment Utilized During Treatment: Gait belt Activity Tolerance: Patient tolerated treatment well Patient left: in chair;with call bell/phone within reach;with chair alarm set Nurse Communication: Mobility status PT Visit Diagnosis: Other abnormalities of gait and mobility (R26.89);Muscle weakness (generalized) (M62.81);Difficulty in walking, not elsewhere classified (R26.2)    Time: 1113-1140 PT Time Calculation (min) (ACUTE ONLY): 27 min   Charges:   PT Evaluation $PT Eval Low Complexity: 1 Low PT Treatments $Gait Training: 8-22 mins   PT G Codes:        Da Michelle B. Migdalia Dk PT, DPT Acute Rehabilitation  [phone] Pager [phone]    [place] 11/08/2016, 12:49 PM

## 2016-11-09 DIAGNOSIS — F1010 Alcohol abuse, uncomplicated: Secondary | ICD-10-CM

## 2016-11-09 DIAGNOSIS — Z720 Tobacco use: Secondary | ICD-10-CM

## 2016-11-09 DIAGNOSIS — F10232 Alcohol dependence with withdrawal with perceptual disturbance: Secondary | ICD-10-CM

## 2016-11-09 LAB — CBC WITH DIFFERENTIAL/PLATELET
Basophils Absolute: 0 K/uL (ref 0.0–0.1)
Basophils Relative: 1 %
Eosinophils Absolute: 0.1 K/uL (ref 0.0–0.7)
Eosinophils Relative: 1 %
HCT: 39.3 % (ref 36.0–46.0)
Hemoglobin: 12.8 g/dL (ref 12.0–15.0)
Lymphocytes Relative: 21 %
Lymphs Abs: 1.5 K/uL (ref 0.7–4.0)
MCH: 30.3 pg (ref 26.0–34.0)
MCHC: 32.6 g/dL (ref 30.0–36.0)
MCV: 92.9 fL (ref 78.0–100.0)
Monocytes Absolute: 0.8 K/uL (ref 0.1–1.0)
Monocytes Relative: 11 %
Neutro Abs: 4.7 K/uL (ref 1.7–7.7)
Neutrophils Relative %: 66 %
Platelets: 254 K/uL (ref 150–400)
RBC: 4.23 MIL/uL (ref 3.87–5.11)
RDW: 13.8 % (ref 11.5–15.5)
WBC: 7.1 K/uL (ref 4.0–10.5)

## 2016-11-09 LAB — COMPREHENSIVE METABOLIC PANEL WITH GFR
ALT: 15 U/L (ref 14–54)
AST: 17 U/L (ref 15–41)
Albumin: 3.3 g/dL — ABNORMAL LOW (ref 3.5–5.0)
Alkaline Phosphatase: 51 U/L (ref 38–126)
Anion gap: 8 (ref 5–15)
BUN: 7 mg/dL (ref 6–20)
CO2: 23 mmol/L (ref 22–32)
Calcium: 8.2 mg/dL — ABNORMAL LOW (ref 8.9–10.3)
Chloride: 108 mmol/L (ref 101–111)
Creatinine, Ser: 0.68 mg/dL (ref 0.44–1.00)
GFR calc Af Amer: >60 mL/min
GFR calc non Af Amer: >60 mL/min
Glucose, Bld: 82 mg/dL (ref 65–99)
Potassium: 3.5 mmol/L (ref 3.5–5.1)
Sodium: 139 mmol/L (ref 135–145)
Total Bilirubin: 0.4 mg/dL (ref 0.3–1.2)
Total Protein: 5.9 g/dL — ABNORMAL LOW (ref 6.5–8.1)

## 2016-11-09 LAB — PHOSPHORUS: PHOSPHORUS: 1.9 mg/dL — AB (ref 2.5–4.6)

## 2016-11-09 LAB — MAGNESIUM: Magnesium: 2 mg/dL (ref 1.7–2.4)

## 2016-11-09 MED ORDER — FOLIC ACID 1 MG PO TABS
1.0000 mg | ORAL_TABLET | Freq: Every day | ORAL | Status: DC
  Administered 2016-11-09 – 2016-11-10 (×2): 1 mg via ORAL
  Filled 2016-11-09 (×2): qty 1

## 2016-11-09 MED ORDER — ADULT MULTIVITAMIN W/MINERALS CH
1.0000 | ORAL_TABLET | Freq: Every day | ORAL | Status: DC
  Administered 2016-11-09 – 2016-11-10 (×2): 1 via ORAL
  Filled 2016-11-09 (×2): qty 1

## 2016-11-09 MED ORDER — POTASSIUM PHOSPHATES 15 MMOLE/5ML IV SOLN
20.0000 mmol | Freq: Once | INTRAVENOUS | Status: AC
  Administered 2016-11-09: 20 mmol via INTRAVENOUS
  Filled 2016-11-09: qty 6.67

## 2016-11-09 NOTE — Progress Notes (Signed)
Neurology progress note:  MRI shows no structural lesion and EEG shows no epileptiform activity.   EEG: Impression: This awake and asleep EEG is normal except for excess amount of diffuse low voltage beta activity.  IMPRESSION: MRI HEAD IMPRESSION:  1. No acute intracranial abnormality identified. 2. Moderate chronic small vessel ischemic disease.  Recommend continue CIWA protocol--no further recommendations. Neurology will S/O  Etta Quill PA-C [facility] [phone]  M-F  (8:30 am- 4 PM)  11/09/2016, 9:53 AM

## 2016-11-09 NOTE — Progress Notes (Signed)
Physical Therapy Treatment Patient Details Name: Yvette Barber MRN: [id number] DOB: Aug 29, 1955 Today's Date: 11/09/2016    History of Present Illness Pt is a 61 y.o. female who was admitted following alcohol related withdrawal seizures. Pt reported drinking 12 beers a day but attempting to stop for the past 3 days. She developed seizure-like activity and friends called EMS. Episodes described as "freezing" with hands shaking. Unable to speak during episodes. Pt additionally reporting nausea/vomiting 11/07/16. PMH significant for alcoholism and asthma.     PT Comments    Pt making progress towards goals today. Pt is currently supervision for bed mobility and transfers and min guard for ambulation of 200 feet with RW. Although, pt still drifts side to side with her gait she is steadier than yesterday. Pt became very tachycardic with performing teeth brushing and gown change with HR reaching 156 bpm, HR also elevated to 135 bpm with ambulation in hallway. Pt requires skilled PT to progress mobility and improve strength and endurance to safely navigate their discharge environment.   Follow Up Recommendations  No PT follow up;Supervision for mobility/OOB     Equipment Recommendations  None recommended by PT    Recommendations for Other Services       Precautions / Restrictions Precautions Precautions: Fall Precaution Comments: Seizures Restrictions Weight Bearing Restrictions: No    Mobility  Bed Mobility Overal bed mobility: Needs Assistance Bed Mobility: Supine to Sit;Sit to Supine     Supine to sit: Supervision     General bed mobility comments: supervision for safety, vc for waiting until lines were arranged so she would not get tangled  Transfers Overall transfer level: Needs assistance Equipment used: None Transfers: Sit to/from Stand Sit to Stand: Supervision         General transfer comment: supervision for safety,   Ambulation/Gait Ambulation/Gait  assistance: Min guard Ambulation Distance (Feet): 200 Feet Assistive device: None;Rolling walker (2 wheeled) Gait Pattern/deviations: Step-through pattern;Decreased step length - right;Decreased step length - left;Shuffle;Trunk flexed;Drifts right/left Gait velocity: slowed Gait velocity interpretation: Below normal speed for age/gender General Gait Details: min guard for safety, pt refused to use RW for ambulation, still exhibits left/rigth drift with gait          Balance Overall balance assessment: Needs assistance Sitting-balance support: Feet supported Sitting balance-Leahy Scale: Good     Standing balance support: No upper extremity supported;During functional activity Standing balance-Leahy Scale: Good Standing balance comment: while brushing teeth and changing gowns                             Cognition Arousal/Alertness: Awake/alert   Overall Cognitive Status: Impaired/Different from baseline Area of Impairment: Orientation;Attention;Following commands;Safety/judgement;Awareness                 Orientation Level: Disoriented to;Time Current Attention Level: Selective   Following Commands: Follows one step commands inconsistently Safety/Judgement: Decreased awareness of safety;Decreased awareness of deficits Awareness: Emergent             General Comments General comments (skin integrity, edema, etc.): prior to activity in seated HR 115bpm, with brushing teeth HR rose to 156 bpm recovered to 125bpm after finished, with ambulating in hallway HR rose to 135bpm, with sitting back in bed HR dropped to 105 bpm. Pt assymptomatic throughout session.      Pertinent Vitals/Pain Pain Assessment: No/denies pain           PT Goals (current goals can now be  found in the care plan section) Acute Rehab PT Goals Patient Stated Goal: go home PT Goal Formulation: With patient Time For Goal Achievement: 11/15/16 Potential to Achieve Goals: Good     Frequency    Min 3X/week      PT Plan Current plan remains appropriate       AM-PAC PT "6 Clicks" Daily Activity  Outcome Measure  Difficulty turning over in bed (including adjusting bedclothes, sheets and blankets)?: A Little Difficulty moving from lying on back to sitting on the side of the bed? : A Little Difficulty sitting down on and standing up from a chair with arms (e.g., wheelchair, bedside commode, etc,.)?: A Little Help needed moving to and from a bed to chair (including a wheelchair)?: A Little Help needed walking in hospital room?: A Little Help needed climbing 3-5 steps with a railing? : A Lot 6 Click Score: 17    End of Session Equipment Utilized During Treatment: Gait belt Activity Tolerance: Patient tolerated treatment well Patient left: in chair;with call bell/phone within reach;with chair alarm set Nurse Communication: Mobility status PT Visit Diagnosis: Other abnormalities of gait and mobility (R26.89);Muscle weakness (generalized) (M62.81);Difficulty in walking, not elsewhere classified (R26.2)     Time: 6384-6659 PT Time Calculation (min) (ACUTE ONLY): 18 min  Charges:  $Gait Training: 8-22 mins                    G Codes:       Lyrica Mcclarty B. Migdalia Dk PT, DPT Acute Rehabilitation  [phone] Pager [phone]     [place] 11/09/2016, 3:40 PM

## 2016-11-09 NOTE — Progress Notes (Signed)
PROGRESS NOTE    HALLELUJAH WYSONG  ZOX:[id number] DOB: 08-Apr-1955 DOA: 11/07/2016 PCP: Patient, No Pcp Per  Brief Narrative:  GWENYTH DINGEE is a 61 y.o. femalewith medical history significant of alcohol Abuse, hx of alcohol withdrawal seizure and DT's 2 yeas ago. She was admitted as she had a withdrawal seizure and aphasia concerning for TIA. Neurology was consulted who recommended MRI and EEG. MRI done and showed no acute intracranial abnormality and EEG  Was normal except for diffuse low voltage beta activity. Currently patient is on a Withdrawal protocol with Lorazepam. Incidentally, found to have an ovarian mass which can be worked up as an outpatient per Dr. Lisbeth Ply conversation with Gyn-Onc Dr. Denman George.    Assessment & Plan:   Active Problems:   Alcohol abuse   Alcohol withdrawal seizure with complication, with unspecified complication (HCC)   Aphasia   Alcohol withdrawal (HCC)   Mass, ovarian   Seizure ([place])   Tobacco abuse   Hypophosphatemia  Alcohol Withdrawal Seizure/Alcohol Withdrawal/Alcohol Abuse -Was having Hallucinations overnight -Neurology starting Scheduled Lorazepam Taper and recommending 1 mg QID, then TID, then BID, then Daily  -Continue CIWA 2-3 mg IV q1hprn Withdrawal with CIWA Score as indicated -C/w Hydroxyzine 25 mg po q6hprn Anxiety/Agigation  -C/w Loperamide -C/w Thiamine 100 mg po Daily; Will start MVI and Folic Acid -Patient now Telemetry patient  -Social Work Consult for   Aphasia, improved -Symptoms resolved and likely was because patient was post-ictal. Concern for TIA ruled out -Head CT w/o Contrast showed No acute intracranial abnormality. Atrophy, chronic microvascular disease -CTA Head and Neck showed No emergent large vessel occlusion or high-grade intracranial stenosis. Bilateral mixed calcified and noncalcified atherosclerosis of the common carotid, cervical internal carotid and proximal intracranial internal carotid arteries. No  hemodynamically significant stenosis. Aortic Atherosclerosis. -MRI Aaron Edelman and MRA Head showed No acute intracranial abnormality identified. Moderate chronic small vessel ischemic disease. MRA showed negative intracranial MRA. No large or proximal arterial branch occlusion. No high-grade or correctable stenosis -EEG showed that the awake and asleep EEG is normal except for excess amount of diffuse low voltage beta activity. -SLP had no follow up recommendations -ECHO showed The estimated ejection fraction was in the range of 60% to 65%. Wall motion was normal; there were no regional wall motion abnormalities. There was an increased relative contribution of atrial contraction to ventricular filling. Doppler parameters are consistent with abnormal left ventricular relaxation (grade 1 diastolic dysfunction). -Neurology Consulted and appreciated Recc's; They feel it was related to Alcoholism and recommend continuing CIWA and have no other recommendations and Signed Off.  -PT recommending no follow up   Ovarian Mass -CT Abdomen showed Very large complex cystic mass occupying the lower abdomen and pelvis, measuring approximately 21.4 x 21.3 x 9.8 cm. 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 Small amount of associated free fluid within the pelvis. -Concern for cystadenoma vs cystadenocarcinoma -CA 125 pending -My Colleague Dr. Lonny Prude discussed case with Gyn-Onc Dr. Denman George who believes it is begnin but will schedule a follow up appointment as an outpatient per their conversation -Patient to find out who her gynecologist is for follow-up. Will need follow-up as an outpatient.  Tobacco Abuse -Smoking Cessation Counseling given -C/w Nicotine 21 mg TD patch q24h  Hypophosphatemia -Patient's Phos Level was 1.9 -Replete with 20 mmol of IV KPhos -Continue to Monitor and Replete as Necessary -Repeat Phos Level in  AM  DVT prophylaxis: SCDs Code Status: FULL CODE Family Communication: Disposition Plan: Home when medically stable to D/C Home  Consultants:   Neurology   Procedures:  EEG Impression: This awake and asleep EEG is normal except for excess amount of diffuse low voltage beta activity.  Clinical Correlation: Diffuse low voltage beta activity is commonly seen with sedating medications such as benzodiazepines.  In the absence of sedating medications, anxiety and hyperthyroidism may produce generalized beta activity.  The absence of epileptiform discharges does not exclude a clinical diagnosis of epilepsy. Clinical correlation is advised.  ECHOCARDIOGRAM Study Conclusions  - Left ventricle: The cavity size was normal. Systolic function was   normal. The estimated ejection fraction was in the range of 60%   to 65%. Wall motion was normal; there were no regional wall   motion abnormalities. There was an increased relative   contribution of atrial contraction to ventricular filling.   Doppler parameters are consistent with abnormal left ventricular   relaxation (grade 1 diastolic dysfunction). - Aortic valve: Trileaflet; normal thickness, mildly calcified   leaflets. - Atrial septum: The septum bowed from left to right, consistent   with increased left atrial pressure. - Tricuspid valve: There was mild regurgitation.   Antimicrobials:  Anti-infectives    None     Subjective: Seen and examined and was more alert today. States she has been an alcoholic for a while. Last drink was 3 days ago. No CP or SOB. Eating breakfast with no issues and per Nurse CIWA was 2 this AM.   Objective: Vitals:   11/09/16 0405 11/09/16 0600 11/09/16 0807 11/09/16 1359  BP: 119/75 123/73 (!) 149/78 (!) 130/94  Pulse: 79 69 92 (!) 101  Resp:   19 19  Temp: 98.1 F (36.7 C)  (!) 97.4 F (36.3 C) 98.2 F (36.8 C)  TempSrc: Oral  Oral Oral  SpO2:   95% 99%  Weight:      Height:         Intake/Output Summary (Last 24 hours) at 11/09/16 1548 Last data filed at 11/09/16 1359  Gross per 24 hour  Intake          3246.67 ml  Output              400 ml  Net          2846.67 ml   Filed Weights   11/07/16 2011 11/08/16 0520  Weight: 68 kg (150 lb) 68 kg (149 lb 14.6 oz)   Examination: Physical Exam:  Constitutional: Caucasian female inNAD and appears calm and comfortable Eyes: Lids and conjunctivae normal, sclerae anicteric  ENMT: External Ears, Nose appear normal. Grossly normal hearing. Mucous membranes are moist. Posterior pharynx clear of any exudate or lesions. Neck: Appears normal, supple, no cervical masses, normal ROM, no appreciable thyromegaly, no JVD Respiratory: Diminished to auscultation bilaterally, no wheezing, rales, rhonchi or crackles. Normal respiratory effort and patient is not tachypenic. No accessory muscle use.  Cardiovascular: RRR, no murmurs / rubs / gallops. S1 and S2 auscultated. No extremity edema. 2+ pedal pulses. No carotid bruits.  Abdomen: Soft, non-tender, non-distended. No masses palpated. No appreciable hepatosplenomegaly. Bowel sounds positive.  GU: Deferred. Musculoskeletal: No clubbing / cyanosis of digits/nails. No joint deformity upper and lower extremities. Good ROM, no contractures.  Skin: No rashes, lesions, ulcers on a limited skin eval. No induration; Warm and dry.  Neurologic: CN 2-12 grossly intact with no focal deficits. Romberg sign cerebellar reflexes not assessed.  Psychiatric: Normal judgment  and insight. Alert and oriented x 3. Normal mood and appropriate affect.   Data Reviewed: I have personally reviewed following labs and imaging studies  CBC:  Recent Labs Lab 11/07/16 2155 11/07/16 2216 11/09/16 0832  WBC 10.2  --  7.1  NEUTROABS 8.4*  --  4.7  HGB 13.9 14.3 12.8  HCT 40.5 42.0 39.3  MCV 91.2  --  92.9  PLT 277  --  347   Basic Metabolic Panel:  Recent Labs Lab 11/07/16 2155 11/07/16 2216  11/08/16 0524 11/09/16 0832  NA 137 136  --  139  K 3.9 4.1  --  3.5  CL 96* 97*  --  108  CO2 26  --   --  23  GLUCOSE 97 93  --  82  BUN 12 12  --  7  CREATININE 0.70 0.60  --  0.68  CALCIUM 9.0  --   --  8.2*  MG  --   --  2.3 2.0  PHOS  --   --  2.3* 1.9*   GFR: Estimated Creatinine Clearance: 67.3 mL/min (by C-G formula based on SCr of 0.68 mg/dL). Liver Function Tests:  Recent Labs Lab 11/07/16 2155 11/09/16 0832  AST 23 17  ALT 18 15  ALKPHOS 73 51  BILITOT 0.9 0.4  PROT 8.0 5.9*  ALBUMIN 4.3 3.3*   No results for input(s): LIPASE, AMYLASE in the last 168 hours. No results for input(s): AMMONIA in the last 168 hours. Coagulation Profile:  Recent Labs Lab 11/07/16 2155  INR 0.93   Cardiac Enzymes:  Recent Labs Lab 11/08/16 0524  TROPONINI <0.03   BNP (last 3 results) No results for input(s): PROBNP in the last 8760 hours. HbA1C:  Recent Labs  11/08/16 0524  HGBA1C 5.3   CBG: No results for input(s): GLUCAP in the last 168 hours. Lipid Profile:  Recent Labs  11/08/16 0524  CHOL 180  HDL 62  LDLCALC 99  TRIG 96  CHOLHDL 2.9   Thyroid Function Tests: No results for input(s): TSH, T4TOTAL, FREET4, T3FREE, THYROIDAB in the last 72 hours. Anemia Panel: No results for input(s): VITAMINB12, FOLATE, FERRITIN, TIBC, IRON, RETICCTPCT in the last 72 hours. Sepsis Labs: No results for input(s): PROCALCITON, LATICACIDVEN in the last 168 hours.  Recent Results (from the past 240 hour(s))  MRSA PCR Screening     Status: None   Collection Time: 11/08/16  4:58 AM  Result Value Ref Range Status   MRSA by PCR NEGATIVE NEGATIVE Final    Comment:        The GeneXpert MRSA Assay (FDA approved for NASAL specimens only), is one component of a comprehensive MRSA colonization surveillance program. It is not intended to diagnose MRSA infection nor to guide or monitor treatment for MRSA infections.     Radiology Studies: Ct Abdomen Pelvis Wo  Contrast  Result Date: 11/08/2016 CLINICAL DATA:  Acute onset of generalized abdominal distention and abdominal pain. Initial encounter. EXAM: CT ABDOMEN AND PELVIS WITHOUT CONTRAST TECHNIQUE: Multidetector CT imaging of the abdomen and pelvis was performed following the standard protocol without IV contrast. COMPARISON:  None. FINDINGS: Lower chest: Minimal left basilar scarring is noted. Mild coronary artery calcifications are seen. There is dense calcification about the patient's bilateral breast implants. Hepatobiliary: The liver is unremarkable in appearance. The gallbladder is unremarkable in appearance. The common bile duct remains normal in caliber. Pancreas: The pancreas is within normal limits. Spleen: A tiny hypodensity within the spleen is nonspecific. The  spleen is otherwise unremarkable. Adrenals/Urinary Tract: The adrenal glands are unremarkable in appearance. A small left renal cyst is noted. The kidneys are difficult to fully assess due to contrast in the renal calyces and ureters. No obstructing ureteral stones are identified. No perinephric stranding is seen. There is no evidence of hydronephrosis. Stomach/Bowel: The stomach is unremarkable in appearance. The small bowel is within normal limits. The appendix is not well characterized; there is no evidence for appendicitis. Mild diverticulosis is noted at the mid sigmoid colon, without evidence of diverticulitis. Vascular/Lymphatic: Scattered calcification is seen along the abdominal aorta and its branches. The abdominal aorta is otherwise grossly unremarkable. The inferior vena cava is grossly unremarkable. No retroperitoneal lymphadenopathy is seen. No pelvic sidewall lymphadenopathy is identified. Reproductive: There is a very large complex cystic mass occupying the lower abdomen and pelvis, measuring approximately 21.4 x 21.3 x 9.8 cm. This has a more solid component on the left side, and most likely arises from the left ovary, concerning for  cystadenoma or cystadenocarcinoma. A small amount of associated free fluid is noted within the pelvis. The patient is status post hysterectomy. The bladder is largely decompressed and grossly unremarkable, with a small amount of contrast noted in the bladder. Other: No additional soft tissue abnormalities are seen. Musculoskeletal: No acute osseous abnormalities are identified. Endplate sclerotic change is noted at L4-L5, with vacuum phenomenon at the lower lumbar spine. The visualized musculature is unremarkable in appearance. IMPRESSION: 1. Very large complex cystic mass occupying the lower abdomen and pelvis, measuring approximately 21.4 x 21.3 x 9.8 cm. 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 Small amount of associated free fluid within the pelvis. 2. Scattered aortic atherosclerosis. 3. Mild coronary artery calcification. 4. Mild diverticulosis at the mid sigmoid colon, without evidence of diverticulitis. 5. Small left renal cyst. These results were called by telephone at the time of interpretation on 11/08/2016 at 1:01 am to Dr. Roxanne Mins, who verbally acknowledged these results. Electronically Signed   By: Garald Balding M.D.   On: 11/08/2016 02:01   Ct Angio Head W Or Wo Contrast  Result Date: 11/08/2016 CLINICAL DATA:  Seizures EXAM: CT ANGIOGRAPHY HEAD AND NECK TECHNIQUE: Multidetector CT imaging of the head and neck was performed using the standard protocol during bolus administration of intravenous contrast. Multiplanar CT image reconstructions and MIPs were obtained to evaluate the vascular anatomy. Carotid stenosis measurements (when applicable) are obtained utilizing NASCET criteria, using the distal internal carotid diameter as the denominator. CONTRAST:  100 mL Isovue 370 COMPARISON:  Head CT 11/07/2016 FINDINGS: CTA NECK FINDINGS Aortic arch: There is no aneurysm or dissection of the visualized  ascending aorta or aortic arch. Normal 3 vessel aortic branching pattern. The visualized proximal subclavian arteries are normal. Mild calcification of the aortic arch. Right carotid system: The right common carotid origin is widely patent. There is no common carotid or internal carotid artery dissection or aneurysm. Mixed calcified and noncalcified plaque at the right carotid bifurcation without associated hemodynamically significant stenosis. Left carotid system: The left common carotid origin is widely patent. There is no common carotid or internal carotid artery dissection or aneurysm. There is noncalcified plaque within the left common carotid artery causing less than 50% stenosis. There is mixed calcified and noncalcified plaque at the carotid bifurcation extending into the proximal left ICA, causing no hemodynamically significant stenosis. Vertebral arteries: The vertebral system is codominant. Both vertebral artery  origins are normal. Both vertebral arteries are normal to their confluence with the basilar artery. Skeleton: There is no bony spinal canal stenosis. No lytic or blastic lesions. Other neck: The nasopharynx is clear. The oropharynx and hypopharynx are normal. The epiglottis is normal. The supraglottic larynx, glottis and subglottic larynx are normal. No retropharyngeal collection. The parapharyngeal spaces are preserved. The parotid and submandibular glands are normal. No sialolithiasis or salivary ductal dilatation. The thyroid gland is normal. There is no cervical lymphadenopathy. Upper chest: No pneumothorax or pleural effusion. No nodules or masses. Review of the MIP images confirms the above findings CTA HEAD FINDINGS Anterior circulation: --Intracranial internal carotid arteries: Multifocal atherosclerotic disease of both internal carotid arteries at the skullbase without flow-limiting stenosis. --Anterior cerebral arteries: Normal. --Middle cerebral arteries: Normal. --Posterior  communicating arteries: Absent bilaterally. Posterior circulation: --Posterior cerebral arteries: Normal. --Superior cerebellar arteries: Normal. --Basilar artery: Normal. --Anterior inferior cerebellar arteries: Normal. --Posterior inferior cerebellar arteries: Normal. Venous sinuses: As permitted by contrast timing, patent. Anatomic variants: None Delayed phase: No parenchymal contrast enhancement. Review of the MIP images confirms the above findings IMPRESSION: 1. No emergent large vessel occlusion or high-grade intracranial stenosis. 2. Bilateral mixed calcified and noncalcified atherosclerosis of the common carotid, cervical internal carotid and proximal intracranial internal carotid arteries. No hemodynamically significant stenosis. 3.  Aortic Atherosclerosis (ICD10-I70.0). Electronically Signed   By: Ulyses Jarred M.D.   On: 11/08/2016 01:33   Dg Chest 2 View  Result Date: 11/08/2016 CLINICAL DATA:  Follow-up transient ischemic attack. Initial encounter. EXAM: CHEST  2 VIEW COMPARISON:  None. FINDINGS: The lungs are well-aerated. Mild vascular congestion is noted. Mild bibasilar opacities may reflect transient interstitial edema. There is no evidence of pleural effusion or pneumothorax. The heart is normal in size; the mediastinal contour is within normal limits. No acute osseous abnormalities are seen. Calcification is noted about the patient's bilateral breast implants. There is remodeling about a chronic fracture of the distal right clavicle. IMPRESSION: Mild vascular congestion. Mild bibasilar opacities may reflect transient interstitial edema. Electronically Signed   By: Garald Balding M.D.   On: 11/08/2016 23:46   Ct Head Wo Contrast  Result Date: 11/07/2016 CLINICAL DATA:  Seizure streak alcohol withdrawal. EXAM: CT HEAD WITHOUT CONTRAST TECHNIQUE: Contiguous axial images were obtained from the base of the skull through the vertex without intravenous contrast. COMPARISON:  None. FINDINGS:  Brain: No evidence of acute infarction, hemorrhage, hydrocephalus, extra-axial collection or mass lesion/mass effect. Mild brain parenchymal volume loss and periventricular microangiopathy. Vascular: Calcific atherosclerotic disease at the skullbase. Skull: Normal. Negative for fracture or focal lesion. Sinuses/Orbits: No acute finding. Other: None. IMPRESSION: No acute intracranial abnormality. Atrophy, chronic microvascular disease. Electronically Signed   By: Fidela Salisbury M.D.   On: 11/07/2016 22:06   Ct Angio Neck W Or Wo Contrast  Result Date: 11/08/2016 CLINICAL DATA:  Seizures EXAM: CT ANGIOGRAPHY HEAD AND NECK TECHNIQUE: Multidetector CT imaging of the head and neck was performed using the standard protocol during bolus administration of intravenous contrast. Multiplanar CT image reconstructions and MIPs were obtained to evaluate the vascular anatomy. Carotid stenosis measurements (when applicable) are obtained utilizing NASCET criteria, using the distal internal carotid diameter as the denominator. CONTRAST:  100 mL Isovue 370 COMPARISON:  Head CT 11/07/2016 FINDINGS: CTA NECK FINDINGS Aortic arch: There is no aneurysm or dissection of the visualized ascending aorta or aortic arch. Normal 3 vessel aortic branching pattern. The visualized proximal subclavian arteries are normal. Mild calcification of the aortic  arch. Right carotid system: The right common carotid origin is widely patent. There is no common carotid or internal carotid artery dissection or aneurysm. Mixed calcified and noncalcified plaque at the right carotid bifurcation without associated hemodynamically significant stenosis. Left carotid system: The left common carotid origin is widely patent. There is no common carotid or internal carotid artery dissection or aneurysm. There is noncalcified plaque within the left common carotid artery causing less than 50% stenosis. There is mixed calcified and noncalcified plaque at the carotid  bifurcation extending into the proximal left ICA, causing no hemodynamically significant stenosis. Vertebral arteries: The vertebral system is codominant. Both vertebral artery origins are normal. Both vertebral arteries are normal to their confluence with the basilar artery. Skeleton: There is no bony spinal canal stenosis. No lytic or blastic lesions. Other neck: The nasopharynx is clear. The oropharynx and hypopharynx are normal. The epiglottis is normal. The supraglottic larynx, glottis and subglottic larynx are normal. No retropharyngeal collection. The parapharyngeal spaces are preserved. The parotid and submandibular glands are normal. No sialolithiasis or salivary ductal dilatation. The thyroid gland is normal. There is no cervical lymphadenopathy. Upper chest: No pneumothorax or pleural effusion. No nodules or masses. Review of the MIP images confirms the above findings CTA HEAD FINDINGS Anterior circulation: --Intracranial internal carotid arteries: Multifocal atherosclerotic disease of both internal carotid arteries at the skullbase without flow-limiting stenosis. --Anterior cerebral arteries: Normal. --Middle cerebral arteries: Normal. --Posterior communicating arteries: Absent bilaterally. Posterior circulation: --Posterior cerebral arteries: Normal. --Superior cerebellar arteries: Normal. --Basilar artery: Normal. --Anterior inferior cerebellar arteries: Normal. --Posterior inferior cerebellar arteries: Normal. Venous sinuses: As permitted by contrast timing, patent. Anatomic variants: None Delayed phase: No parenchymal contrast enhancement. Review of the MIP images confirms the above findings IMPRESSION: 1. No emergent large vessel occlusion or high-grade intracranial stenosis. 2. Bilateral mixed calcified and noncalcified atherosclerosis of the common carotid, cervical internal carotid and proximal intracranial internal carotid arteries. No hemodynamically significant stenosis. 3.  Aortic  Atherosclerosis (ICD10-I70.0). Electronically Signed   By: Ulyses Jarred M.D.   On: 11/08/2016 01:33   Mr Brain Wo Contrast  Result Date: 11/08/2016 CLINICAL DATA:  Initial evaluation for alcohol or related which oral seizures, difficulty speaking, expressive aphasia. EXAM: MRI HEAD WITHOUT CONTRAST MRA HEAD WITHOUT CONTRAST TECHNIQUE: Multiplanar, multiecho pulse sequences of the brain and surrounding structures were obtained without intravenous contrast. Angiographic images of the head were obtained using MRA technique without contrast. COMPARISON:  Prior CT from earlier the same day. FINDINGS: MRI HEAD FINDINGS Brain: Generalized cerebral atrophy. Patchy T2/FLAIR hyperintensity within the periventricular and deep white matter both cerebral hemispheres most consistent with chronic small vessel ischemic disease, moderate nature. No abnormal foci of restricted diffusion to suggest acute or subacute ischemia. Gray-white matter differentiation maintained. No evidence for acute or chronic intracranial hemorrhage. No encephalomalacia to suggest chronic infarction. No mass lesion, midline shift or mass effect. Ventricles normal in size without evidence for hydrocephalus. No extra-axial fluid collection. Major dural sinuses are grossly patent. No intrinsic temporal lobe abnormality. Pituitary gland suprasellar region within normal limits. Midline structures intact and normal. Vascular: Major intracranial vascular flow voids are maintained. Skull and upper cervical spine: Craniocervical junction normal. Multilevel degenerate spondylolysis noted within the upper cervical spine without significant stenosis. Bone marrow signal intensity within normal limits. 14 mm T2 hyperintense scalp lesion at the level the vertex noted, of doubtful significance. Scalp soft tissues otherwise unremarkable. Sinuses/Orbits: Globes and orbital soft tissues within normal limits. Paranasal sinuses are clear. No significant  mastoid effusion.  Inner ear structures normal. Other: None. MRA HEAD FINDINGS ANTERIOR CIRCULATION: Distal cervical segments of the internal carotid arteries are patent with antegrade flow. Petrous, cavernous, and supraclinoid segments patent without flow-limiting stenosis. ICA termini widely patent. Normal anterior communicating artery. Anterior cerebral artery is widely patent to their distal aspects. M1 segments patent without stenosis or occlusion. MCA bifurcations normal. No proximal M2 occlusion. Distal MCA branches well perfused and symmetric. POSTERIOR CIRCULATION: Vertebral artery is widely patent to the vertebrobasilar junction without stenosis. Posterior inferior cerebral arteries not seen on this exam. Basilar artery widely patent to its distal aspect. Superior cerebral artery is patent bilaterally. Both of the posterior cerebral artery supplied via the basilar artery and are well perfused to their distal aspects without stenosis. No aneurysm or vascular malformation. 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 IMPRESSION: MRI HEAD IMPRESSION: 1. No acute intracranial abnormality identified. 2. Moderate chronic small vessel ischemic disease. MRA HEAD IMPRESSION: Negative intracranial MRA. No large or proximal arterial branch occlusion. No high-grade or correctable stenosis. Electronically Signed   By: Jeannine Boga M.D.   On: 11/08/2016 22:04   Mr Jodene Nam Head Wo Contrast  Result Date: 11/08/2016 CLINICAL DATA:  Initial evaluation for alcohol or related which oral seizures, difficulty speaking, expressive aphasia. EXAM: MRI HEAD WITHOUT CONTRAST MRA HEAD WITHOUT CONTRAST TECHNIQUE: Multiplanar, multiecho pulse sequences of the brain and surrounding structures were obtained without intravenous contrast. Angiographic images of the head were obtained using MRA technique  without contrast. COMPARISON:  Prior CT from earlier the same day. FINDINGS: MRI HEAD FINDINGS Brain: Generalized cerebral atrophy. Patchy T2/FLAIR hyperintensity within the periventricular and deep white matter both cerebral hemispheres most consistent with chronic small vessel ischemic disease, moderate nature. No abnormal foci of restricted diffusion to suggest acute or subacute ischemia. Gray-white matter differentiation maintained. No evidence for acute or chronic intracranial hemorrhage. No encephalomalacia to suggest chronic infarction. No mass lesion, midline shift or mass effect. Ventricles normal in size without evidence for hydrocephalus. No extra-axial fluid collection. Major dural sinuses are grossly patent. No intrinsic temporal lobe abnormality. Pituitary gland suprasellar region within normal limits. Midline structures intact and normal. Vascular: Major intracranial vascular flow voids are maintained. Skull and upper cervical spine: Craniocervical junction normal. Multilevel degenerate spondylolysis noted within the upper cervical spine without significant stenosis. Bone marrow signal intensity within normal limits. 14 mm T2 hyperintense scalp lesion at the level the vertex noted, of doubtful significance. Scalp soft tissues otherwise unremarkable. Sinuses/Orbits: Globes and orbital soft tissues within normal limits. Paranasal sinuses are clear. No significant mastoid effusion. Inner ear structures normal. Other: None. MRA HEAD FINDINGS ANTERIOR CIRCULATION: Distal cervical segments of the internal carotid arteries are patent with antegrade flow. Petrous, cavernous, and supraclinoid segments patent without flow-limiting stenosis. ICA termini widely patent. Normal anterior communicating artery. Anterior cerebral artery is widely patent to their distal aspects. M1 segments patent without stenosis or occlusion. MCA bifurcations normal. No proximal M2 occlusion. Distal MCA branches well perfused and  symmetric. POSTERIOR CIRCULATION: Vertebral artery is widely patent to the vertebrobasilar junction without stenosis. Posterior inferior cerebral arteries not seen on this exam. Basilar artery widely patent to its distal aspect. Superior cerebral artery is patent bilaterally. Both of the posterior cerebral artery supplied via the basilar artery and are well perfused to their distal aspects without stenosis. No aneurysm  or vascular malformation. 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 IMPRESSION: MRI HEAD IMPRESSION: 1. No acute intracranial abnormality identified. 2. Moderate chronic small vessel ischemic disease. MRA HEAD IMPRESSION: Negative intracranial MRA. No large or proximal arterial branch occlusion. No high-grade or correctable stenosis. Electronically Signed   By: Jeannine Boga M.D.   On: 11/08/2016 22:04   Scheduled Meds: . folic acid  1 mg Oral Daily  . LORazepam  1 mg Oral TID   Followed by  . [START ON 11/10/2016] LORazepam  1 mg Oral BID   Followed by  . [START ON 11/12/2016] LORazepam  1 mg Oral Daily  . multivitamin with minerals  1 tablet Oral Daily  . nicotine  21 mg Transdermal Daily  . thiamine  100 mg Oral Daily   Or  . thiamine  100 mg Intravenous Daily   Continuous Infusions: . sodium chloride 125 mL/hr at 11/08/16 0502  . potassium PHOSPHATE IVPB (mmol)      LOS: 1 day   Kerney Elbe, DO [facility] Pager [phone]  If 7PM-7AM, please contact night-coverage [email or web address] Password [email or web address] 11/09/2016, 3:48 PM

## 2016-11-09 NOTE — Evaluation (Signed)
Speech Language Pathology Evaluation Patient Details Name: Yvette Barber MRN: [id number] DOB: 10/25/1955 Today's Date: 11/09/2016 Time: 7517-0017 SLP Time Calculation (min) (ACUTE ONLY): 24 min  Problem List:  Patient Active Problem List   Diagnosis Date Noted  . Alcohol abuse 11/08/2016  . Alcohol withdrawal seizure with complication, with unspecified complication ([place]) 49/44/9675  . Aphasia 11/08/2016  . TIA (transient ischemic attack) 11/08/2016  . Alcohol withdrawal ([place]) 11/08/2016  . Mass, ovarian 11/08/2016  . Seizure [facility])    Past Medical History:  Past Medical History:  Diagnosis Date  . Alcoholism ([place])   . Asthma    Past Surgical History:  Past Surgical History:  Procedure Laterality Date  . ABDOMINAL HYSTERECTOMY    . BREAST SURGERY    . PLACEMENT OF BREAST IMPLANTS     HPI:  Yvette Zinger Beesonis a 61 y.o.femalewith medical history significant of alcohol abuse, hx of alcohol withdrawal seizure and DT's 2 yeas ago and asthma. Presented with seizure-like activity reported drinking 12 beers a day and stopped 3 days ago. Per chart reports trouble speaking for past 3 days with expressive aphasia that is intermittent. CT No acute intracranial abnormality. Atrophy, chronic microvascular disease. CXR not completed.    Assessment / Plan / Recommendation Clinical Impression  Pt given portions of Cognistat and MOCA. Required mod-max assist for clock drawing and filling in time. Scored in the severe impairment range on Cognistat memory task exhibiting deficits mostly for storage of new information. Verbal problem solving (simple/functional math) and safety awareness appeared mostly intact. Educated pt re: memory strategies (calendar, free phone apps, lists). SLP noted decreased semantics and 1-2 paraphasias during swallow assessment yesterday. No linguistic impairments identified today. Pt concerned if she is getting dementia. SLP explained that ST does not diagnose dementia  (would be neuropsychologist) but that ETOH abuse can negatively impact cognitive function. Pt could benefit from home health to facilitate memory however uncertain if she would comply (?).       SLP Assessment  SLP Recommendation/Assessment: Patient does not need any further Speech Lanaguage Pathology Services SLP Visit Diagnosis: Cognitive communication deficit (R41.841)    Follow Up Recommendations  None    Frequency and Duration           SLP Evaluation Cognition  Overall Cognitive Status: Impaired/Different from baseline (suspect, baseline unknown) Arousal/Alertness: Awake/alert Orientation Level: Oriented X4 Attention: Sustained Sustained Attention: Appears intact Memory: Impaired Memory Impairment:  (4 word retrieval, severe impairment range Cognistat) Awareness: Impaired Awareness Impairment: Anticipatory impairment Problem Solving: Appears intact Safety/Judgment: Appears intact       Comprehension  Auditory Comprehension Overall Auditory Comprehension: Appears within functional limits for tasks assessed Visual Recognition/Discrimination Discrimination: Not tested Reading Comprehension Reading Status: Not tested    Expression Expression Primary Mode of Expression: Verbal Verbal Expression Overall Verbal Expression: Appears within functional limits for tasks assessed Pragmatics: No impairment Written Expression Dominant Hand: Right Written Expression: Not tested   Oral / Motor  Oral Motor/Sensory Function Overall Oral Motor/Sensory Function: Within functional limits Motor Speech Overall Motor Speech: Appears within functional limits for tasks assessed Intelligibility: Intelligible Motor Planning: Witnin functional limits   GO                    Houston Siren 11/09/2016, 10:33 AM  Orbie Pyo Colvin Caroli.[employer] [phone]

## 2016-11-10 LAB — URINALYSIS, ROUTINE W REFLEX MICROSCOPIC
Bilirubin Urine: NEGATIVE
Glucose, UA: NEGATIVE mg/dL
Hgb urine dipstick: NEGATIVE
KETONES UR: NEGATIVE mg/dL
LEUKOCYTES UA: NEGATIVE
NITRITE: NEGATIVE
PROTEIN: NEGATIVE mg/dL
Specific Gravity, Urine: 1.003 — ABNORMAL LOW (ref 1.005–1.030)
pH: 7 (ref 5.0–8.0)

## 2016-11-10 LAB — COMPREHENSIVE METABOLIC PANEL
ALK PHOS: 48 U/L (ref 38–126)
ALT: 14 U/L (ref 14–54)
AST: 20 U/L (ref 15–41)
Albumin: 3 g/dL — ABNORMAL LOW (ref 3.5–5.0)
Anion gap: 7 (ref 5–15)
BUN: <5 mg/dL — ABNORMAL LOW (ref 6–20)
CALCIUM: 7.9 mg/dL — AB (ref 8.9–10.3)
CO2: 20 mmol/L — AB (ref 22–32)
CREATININE: 0.64 mg/dL (ref 0.44–1.00)
Chloride: 112 mmol/L — ABNORMAL HIGH (ref 101–111)
Glucose, Bld: 81 mg/dL (ref 65–99)
Potassium: 3.5 mmol/L (ref 3.5–5.1)
SODIUM: 139 mmol/L (ref 135–145)
Total Bilirubin: 0.6 mg/dL (ref 0.3–1.2)
Total Protein: 5.4 g/dL — ABNORMAL LOW (ref 6.5–8.1)

## 2016-11-10 LAB — CBC WITH DIFFERENTIAL/PLATELET
Basophils Absolute: 0 10*3/uL (ref 0.0–0.1)
Basophils Relative: 0 %
Eosinophils Absolute: 0.1 10*3/uL (ref 0.0–0.7)
Eosinophils Relative: 2 %
HCT: 37.5 % (ref 36.0–46.0)
HEMOGLOBIN: 12 g/dL (ref 12.0–15.0)
LYMPHS ABS: 1 10*3/uL (ref 0.7–4.0)
Lymphocytes Relative: 15 %
MCH: 30 pg (ref 26.0–34.0)
MCHC: 32 g/dL (ref 30.0–36.0)
MCV: 93.8 fL (ref 78.0–100.0)
Monocytes Absolute: 1.1 10*3/uL — ABNORMAL HIGH (ref 0.1–1.0)
Monocytes Relative: 15 %
NEUTROS PCT: 68 %
Neutro Abs: 4.7 10*3/uL (ref 1.7–7.7)
Platelets: 212 10*3/uL (ref 150–400)
RBC: 4 MIL/uL (ref 3.87–5.11)
RDW: 14.2 % (ref 11.5–15.5)
WBC: 6.9 10*3/uL (ref 4.0–10.5)

## 2016-11-10 LAB — MAGNESIUM: Magnesium: 2 mg/dL (ref 1.7–2.4)

## 2016-11-10 LAB — CA 125
CANCER ANTIGEN (CA) 125: 14.7 U/mL (ref 0.0–38.1)
CANCER ANTIGEN (CA) 125: 14.4 U/mL (ref 0.0–38.1)

## 2016-11-10 LAB — PHOSPHORUS: PHOSPHORUS: 2 mg/dL — AB (ref 2.5–4.6)

## 2016-11-10 MED ORDER — K PHOS MONO-SOD PHOS DI & MONO 155-852-130 MG PO TABS: 500.0000 mg | ORAL_TABLET | Freq: Three times a day (TID) | ORAL | 0 refills | Status: AC

## 2016-11-10 MED ORDER — THIAMINE HCL 100 MG PO TABS: 100.0000 mg | ORAL_TABLET | Freq: Every day | ORAL | 0 refills | Status: AC

## 2016-11-10 MED ORDER — FOLIC ACID 1 MG PO TABS: 1.0000 mg | ORAL_TABLET | Freq: Every day | ORAL | 0 refills | Status: AC

## 2016-11-10 MED ORDER — SENNOSIDES-DOCUSATE SODIUM 8.6-50 MG PO TABS: 1.0000 | ORAL_TABLET | Freq: Every evening | ORAL | 0 refills | Status: AC | PRN

## 2016-11-10 MED ORDER — K PHOS MONO-SOD PHOS DI & MONO 155-852-130 MG PO TABS
500.0000 mg | ORAL_TABLET | Freq: Three times a day (TID) | ORAL | Status: DC
  Filled 2016-11-10 (×2): qty 2

## 2016-11-10 MED ORDER — ADULT MULTIVITAMIN W/MINERALS CH: 1.0000 | ORAL_TABLET | Freq: Every day | ORAL | 0 refills | Status: AC

## 2016-11-10 MED ORDER — NICOTINE 21 MG/24HR TD PT24: 21.0000 mg | MEDICATED_PATCH | Freq: Every day | TRANSDERMAL | 0 refills | Status: AC

## 2016-11-10 MED ORDER — HYDROXYZINE HCL 25 MG PO TABS: 25.0000 mg | ORAL_TABLET | Freq: Four times a day (QID) | ORAL | 0 refills | Status: DC | PRN

## 2016-11-10 NOTE — Discharge Summary (Signed)
Physician Discharge Summary  Yvette Barber XBM:[id number] DOB: 1955/06/29 DOA: 11/07/2016  PCP: Patient, No Pcp Per  Admit date: 11/07/2016 Discharge date: 11/10/2016  Admitted From: Home Disposition: Home  Recommendations for Outpatient Follow-up:  1. Follow up with PCP Dr. Shelia Media in 1-2 weeks 2. Follow up with Resources provided for you by Social Worker 3. Follow up with Dr. Denman George in Gyn-Onc for Ovarian Mass on 11/28/16 at 10:45 AM 4. Please obtain CMP/CBC, Mag, Phos in one week  Home Health: None  Equipment/Devices: None recommended by PT    Discharge Condition: Stable CODE STATUS: FULL CODE Diet recommendation: Heart Healthy Diet  Brief/Interim Summary: Yvette Yiu Beesonis a 61 y.o.femalewith medical history significant of alcohol Abuse, hx of alcohol withdrawal seizure and DT's 2 yeas ago and other comorbids. She was admitted as she had a withdrawal seizure and aphasia concerning for TIA. Neurology was consulted who recommended MRI and EEG. MRI done and showed no acute intracranial abnormality and EEG  Was normal except for diffuse low voltage beta activity. Patient was placed on a Withdrawal protocol with Lorazepam and improved tremendously and will be discharged on Hydroxyzine. Incidentally, found to have an ovarian mass which can be worked up as an outpatient per Dr. Lisbeth Ply conversation with Gyn-Onc Dr. Denman George and patient will have a follow up appointment with Dr. Denman George at D/C per my conversation with her. Patient was deemed medically stable to D/C home and will need to follow up with PCP, Gyn-Onc Dr. Denman George, and follow up on resources provided by Social Worker as patient wants to pursue outpatient treatment options.    Discharge Diagnoses:  Active Problems:   Alcohol abuse   Alcohol withdrawal seizure with complication, with unspecified complication (HCC)   Aphasia   Alcohol withdrawal (HCC)   Mass, ovarian   Seizure ([place])   Tobacco abuse   Hypophosphatemia  Alcohol  Withdrawal Seizure/Alcohol Withdrawal/Alcohol Abuse -Was having Hallucinations during hospitalization but improved  -Neurology starting Scheduled Lorazepam Taper and recommending 1 mg QID, then TID, then BID and then daily. Patient is now down to Lorazepam BID but will not D/C with any Lorazepam at D/C -Continue CIWA 2-3 mg IV q1hprn Withdrawal with CIWA Score as indicated -C/w Hydroxyzine 25 mg po q6hprn Anxiety/Agigation at D/C  -C/w Thiamine 100 mg po Daily, MVI, and Folic Acid at D/C -Patient now Telemetry patient  -Social Work Scientific laboratory technician for providing Substance Abuse Resources  Aphasia, improved -Symptoms resolved and likely was because patient was post-ictal. Concern for TIA ruled out -Head CT w/o Contrast showed No acute intracranial abnormality. Atrophy, chronic microvascular disease -CTA Head and Neck showed No emergent large vessel occlusion or high-grade intracranial stenosis. Bilateral mixed calcified and noncalcified atherosclerosis of the common carotid, cervical internal carotid and proximal intracranial internal carotid arteries. No hemodynamically significant stenosis. Aortic Atherosclerosis. -MRI Yvette Barber and MRA Head showed No acute intracranial abnormality identified. Moderate chronic small vessel ischemic disease. MRA showed negative intracranial MRA. No large or proximal arterial branch occlusion. No high-grade or correctable stenosis -EEG showed that the awake and asleepEEG is normal except for excess amount of diffuse low voltage beta activity. -SLP had no follow up recommendations -ECHO showed The estimated ejection fraction was in the range of 60% to 65%. Wall motion was normal; there were no regional wall motion abnormalities. There was an increased relative contribution of atrial contraction to ventricular filling. Doppler parameters are consistent with abnormal left ventricularrelaxation (grade 1 diastolic dysfunction). -Neurology Consulted and appreciated Recc's; They  feel it  was related to Alcoholism and recommend continuing CIWA and have no other recommendations and Signed Off.  -PT recommending no follow up   Ovarian Mass -CT Abdomen showed Very large complex cystic mass occupying the lower abdomen and pelvis, measuring approximately 21.4 x 21.3 x 9.8 cm. 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 Small amount of associated free fluid within the pelvis. -Concern for cystadenoma vs cystadenocarcinoma -CA 125 was 14.7 and 14.4 and within normal limits  -My Colleague Dr. Lonny Prude discussed case with Gyn-Onc Dr. Denman George who believes it is begnin but will schedule a follow up appointment as an outpatient per their conversation -I called Dr. Denman George and office is scheduling follow up appointment for D/C  Tobacco Abuse -Smoking Cessation Counseling given -C/w Nicotine 21 mg TD patch q24h as an outpatient   Hypophosphatemia -Patient's Phos Level was 20 -Replete with 20 mmol of IV KPhos yesterday; Will D/C on KPhos 500 mg TID x 1 day -Continue to Monitor and Replete as Necessary -Repeat Phos Level as an outpatient with PCP   Discharge Instructions  Discharge Instructions    Call MD for:  difficulty breathing, headache or visual disturbances    Complete by:  As directed    Call MD for:  extreme fatigue    Complete by:  As directed    Call MD for:  hives    Complete by:  As directed    Call MD for:  persistant dizziness or light-headedness    Complete by:  As directed    Call MD for:  persistant nausea and vomiting    Complete by:  As directed    Call MD for:  redness, tenderness, or signs of infection (pain, swelling, redness, odor or green/yellow discharge around incision site)    Complete by:  As directed    Call MD for:  severe uncontrolled pain    Complete by:  As directed    Call MD for:  temperature >100.4    Complete by:  As directed    Diet - low  sodium heart healthy    Complete by:  As directed    Discharge instructions    Complete by:  As directed    Follow up with Dr. Shelia Media for Hospital follow up and utilize resources provided for you by Education officer, museum. Follow up with Dr. Denman George for Ovarian Mass found for further follow up and evaluation. Take all medications as prescribed. If symptoms change or worsen please return to the ED for evaluation.   Increase activity slowly    Complete by:  As directed      Allergies as of 11/10/2016   No Known Allergies     Medication List    TAKE these medications   folic acid 1 MG tablet Commonly known as:  FOLVITE Take 1 tablet (1 mg total) by mouth daily.   hydrOXYzine 25 MG tablet Commonly known as:  ATARAX/VISTARIL Take 1 tablet (25 mg total) by mouth every 6 (six) hours as needed (anxiety/agitation or CIWA < or = 10).   loratadine 10 MG tablet Commonly known as:  CLARITIN Take 10 mg by mouth daily as needed for allergies.   multivitamin with minerals Tabs tablet Take 1 tablet by mouth daily.   nicotine 21 mg/24hr patch Commonly known as:  NICODERM CQ - dosed in mg/24 hours Place 1 patch (21 mg total) onto the skin daily.   phosphorus [phone]  MG tablet Commonly known as:  K PHOS NEUTRAL Take 2 tablets (500 mg total) by mouth 3 (three) times daily.   senna-docusate 8.6-50 MG tablet Commonly known as:  Senokot-S Take 1 tablet by mouth at bedtime as needed for mild constipation.   thiamine 100 MG tablet Take 1 tablet (100 mg total) by mouth daily.      Follow-up Information    Everitt Amber, MD Follow up on 11/28/2016.   Specialty:  Obstetrics and Gynecology Why:  at 10:45am Contact information: [place] [address] [phone]        Deland Pretty, MD. Call.   Specialty:  Internal Medicine Why:  Follow up within 1 week Contact information: [address] [place] [address] [phone]          No Known  Allergies  Consultations:  Gyn-Onc Dr. Denman George via Telephone [facility]  Neurology Dr. Cheral Marker   Procedures/Studies: Ct Abdomen Pelvis Wo Contrast  Result Date: 11/08/2016 CLINICAL DATA:  Acute onset of generalized abdominal distention and abdominal pain. Initial encounter. EXAM: CT ABDOMEN AND PELVIS WITHOUT CONTRAST TECHNIQUE: Multidetector CT imaging of the abdomen and pelvis was performed following the standard protocol without IV contrast. COMPARISON:  None. FINDINGS: Lower chest: Minimal left basilar scarring is noted. Mild coronary artery calcifications are seen. There is dense calcification about the patient's bilateral breast implants. Hepatobiliary: The liver is unremarkable in appearance. The gallbladder is unremarkable in appearance. The common bile duct remains normal in caliber. Pancreas: The pancreas is within normal limits. Spleen: A tiny hypodensity within the spleen is nonspecific. The spleen is otherwise unremarkable. Adrenals/Urinary Tract: The adrenal glands are unremarkable in appearance. A small left renal cyst is noted. The kidneys are difficult to fully assess due to contrast in the renal calyces and ureters. No obstructing ureteral stones are identified. No perinephric stranding is seen. There is no evidence of hydronephrosis. Stomach/Bowel: The stomach is unremarkable in appearance. The small bowel is within normal limits. The appendix is not well characterized; there is no evidence for appendicitis. Mild diverticulosis is noted at the mid sigmoid colon, without evidence of diverticulitis. Vascular/Lymphatic: Scattered calcification is seen along the abdominal aorta and its branches. The abdominal aorta is otherwise grossly unremarkable. The inferior vena cava is grossly unremarkable. No retroperitoneal lymphadenopathy is seen. No pelvic sidewall lymphadenopathy is identified. Reproductive: There is a very large complex cystic mass occupying the lower abdomen and pelvis,  measuring approximately 21.4 x 21.3 x 9.8 cm. 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 A small amount of associated free fluid is noted within the pelvis. The patient is status post hysterectomy. The bladder is largely decompressed and grossly unremarkable, with a small amount of contrast noted in the bladder. Other: No additional soft tissue abnormalities are seen. Musculoskeletal: No acute osseous abnormalities are identified. Endplate sclerotic change is noted at L4-L5, with vacuum phenomenon at the lower lumbar spine. The visualized musculature is unremarkable in appearance. IMPRESSION: 1. Very large complex cystic mass occupying the lower abdomen and pelvis, measuring approximately 21.4 x 21.3 x 9.8 cm. 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 Small amount of associated free fluid within the pelvis. 2. Scattered aortic atherosclerosis. 3. Mild coronary artery calcification. 4. Mild diverticulosis at the mid sigmoid colon, without evidence of diverticulitis. 5. Small left  renal cyst. These results were called by telephone at the time of interpretation on 11/08/2016 at 7:56 am to Dr. Roxanne Mins, who verbally acknowledged these results. Electronically Signed   By: Garald Balding M.D.   On: 11/08/2016 02:01   Ct Angio Head W Or Wo Contrast  Result Date: 11/08/2016 CLINICAL DATA:  Seizures EXAM: CT ANGIOGRAPHY HEAD AND NECK TECHNIQUE: Multidetector CT imaging of the head and neck was performed using the standard protocol during bolus administration of intravenous contrast. Multiplanar CT image reconstructions and MIPs were obtained to evaluate the vascular anatomy. Carotid stenosis measurements (when applicable) are obtained utilizing NASCET criteria, using the distal internal carotid diameter as the denominator.  CONTRAST:  100 mL Isovue 370 COMPARISON:  Head CT 11/07/2016 FINDINGS: CTA NECK FINDINGS Aortic arch: There is no aneurysm or dissection of the visualized ascending aorta or aortic arch. Normal 3 vessel aortic branching pattern. The visualized proximal subclavian arteries are normal. Mild calcification of the aortic arch. Right carotid system: The right common carotid origin is widely patent. There is no common carotid or internal carotid artery dissection or aneurysm. Mixed calcified and noncalcified plaque at the right carotid bifurcation without associated hemodynamically significant stenosis. Left carotid system: The left common carotid origin is widely patent. There is no common carotid or internal carotid artery dissection or aneurysm. There is noncalcified plaque within the left common carotid artery causing less than 50% stenosis. There is mixed calcified and noncalcified plaque at the carotid bifurcation extending into the proximal left ICA, causing no hemodynamically significant stenosis. Vertebral arteries: The vertebral system is codominant. Both vertebral artery origins are normal. Both vertebral arteries are normal to their confluence with the basilar artery. Skeleton: There is no bony spinal canal stenosis. No lytic or blastic lesions. Other neck: The nasopharynx is clear. The oropharynx and hypopharynx are normal. The epiglottis is normal. The supraglottic larynx, glottis and subglottic larynx are normal. No retropharyngeal collection. The parapharyngeal spaces are preserved. The parotid and submandibular glands are normal. No sialolithiasis or salivary ductal dilatation. The thyroid gland is normal. There is no cervical lymphadenopathy. Upper chest: No pneumothorax or pleural effusion. No nodules or masses. Review of the MIP images confirms the above findings CTA HEAD FINDINGS Anterior circulation: --Intracranial internal carotid arteries: Multifocal atherosclerotic disease of both internal carotid  arteries at the skullbase without flow-limiting stenosis. --Anterior cerebral arteries: Normal. --Middle cerebral arteries: Normal. --Posterior communicating arteries: Absent bilaterally. Posterior circulation: --Posterior cerebral arteries: Normal. --Superior cerebellar arteries: Normal. --Basilar artery: Normal. --Anterior inferior cerebellar arteries: Normal. --Posterior inferior cerebellar arteries: Normal. Venous sinuses: As permitted by contrast timing, patent. Anatomic variants: None Delayed phase: No parenchymal contrast enhancement. Review of the MIP images confirms the above findings IMPRESSION: 1. No emergent large vessel occlusion or high-grade intracranial stenosis. 2. Bilateral mixed calcified and noncalcified atherosclerosis of the common carotid, cervical internal carotid and proximal intracranial internal carotid arteries. No hemodynamically significant stenosis. 3.  Aortic Atherosclerosis (ICD10-I70.0). Electronically Signed   By: Ulyses Jarred M.D.   On: 11/08/2016 01:33   Dg Chest 2 View  Result Date: 11/08/2016 CLINICAL DATA:  Follow-up transient ischemic attack. Initial encounter. EXAM: CHEST  2 VIEW COMPARISON:  None. FINDINGS: The lungs are well-aerated. Mild vascular congestion is noted. Mild bibasilar opacities may reflect transient interstitial edema. There is no evidence of pleural effusion or pneumothorax. The heart is normal in size; the mediastinal contour is within normal limits. No acute osseous abnormalities are seen. Calcification is noted about the patient's bilateral breast  implants. There is remodeling about a chronic fracture of the distal right clavicle. IMPRESSION: Mild vascular congestion. Mild bibasilar opacities may reflect transient interstitial edema. Electronically Signed   By: Garald Balding M.D.   On: 11/08/2016 23:46   Ct Head Wo Contrast  Result Date: 11/07/2016 CLINICAL DATA:  Seizure streak alcohol withdrawal. EXAM: CT HEAD WITHOUT CONTRAST TECHNIQUE:  Contiguous axial images were obtained from the base of the skull through the vertex without intravenous contrast. COMPARISON:  None. FINDINGS: Brain: No evidence of acute infarction, hemorrhage, hydrocephalus, extra-axial collection or mass lesion/mass effect. Mild brain parenchymal volume loss and periventricular microangiopathy. Vascular: Calcific atherosclerotic disease at the skullbase. Skull: Normal. Negative for fracture or focal lesion. Sinuses/Orbits: No acute finding. Other: None. IMPRESSION: No acute intracranial abnormality. Atrophy, chronic microvascular disease. Electronically Signed   By: Fidela Salisbury M.D.   On: 11/07/2016 22:06   Ct Angio Neck W Or Wo Contrast  Result Date: 11/08/2016 CLINICAL DATA:  Seizures EXAM: CT ANGIOGRAPHY HEAD AND NECK TECHNIQUE: Multidetector CT imaging of the head and neck was performed using the standard protocol during bolus administration of intravenous contrast. Multiplanar CT image reconstructions and MIPs were obtained to evaluate the vascular anatomy. Carotid stenosis measurements (when applicable) are obtained utilizing NASCET criteria, using the distal internal carotid diameter as the denominator. CONTRAST:  100 mL Isovue 370 COMPARISON:  Head CT 11/07/2016 FINDINGS: CTA NECK FINDINGS Aortic arch: There is no aneurysm or dissection of the visualized ascending aorta or aortic arch. Normal 3 vessel aortic branching pattern. The visualized proximal subclavian arteries are normal. Mild calcification of the aortic arch. Right carotid system: The right common carotid origin is widely patent. There is no common carotid or internal carotid artery dissection or aneurysm. Mixed calcified and noncalcified plaque at the right carotid bifurcation without associated hemodynamically significant stenosis. Left carotid system: The left common carotid origin is widely patent. There is no common carotid or internal carotid artery dissection or aneurysm. There is  noncalcified plaque within the left common carotid artery causing less than 50% stenosis. There is mixed calcified and noncalcified plaque at the carotid bifurcation extending into the proximal left ICA, causing no hemodynamically significant stenosis. Vertebral arteries: The vertebral system is codominant. Both vertebral artery origins are normal. Both vertebral arteries are normal to their confluence with the basilar artery. Skeleton: There is no bony spinal canal stenosis. No lytic or blastic lesions. Other neck: The nasopharynx is clear. The oropharynx and hypopharynx are normal. The epiglottis is normal. The supraglottic larynx, glottis and subglottic larynx are normal. No retropharyngeal collection. The parapharyngeal spaces are preserved. The parotid and submandibular glands are normal. No sialolithiasis or salivary ductal dilatation. The thyroid gland is normal. There is no cervical lymphadenopathy. Upper chest: No pneumothorax or pleural effusion. No nodules or masses. Review of the MIP images confirms the above findings CTA HEAD FINDINGS Anterior circulation: --Intracranial internal carotid arteries: Multifocal atherosclerotic disease of both internal carotid arteries at the skullbase without flow-limiting stenosis. --Anterior cerebral arteries: Normal. --Middle cerebral arteries: Normal. --Posterior communicating arteries: Absent bilaterally. Posterior circulation: --Posterior cerebral arteries: Normal. --Superior cerebellar arteries: Normal. --Basilar artery: Normal. --Anterior inferior cerebellar arteries: Normal. --Posterior inferior cerebellar arteries: Normal. Venous sinuses: As permitted by contrast timing, patent. Anatomic variants: None Delayed phase: No parenchymal contrast enhancement. Review of the MIP images confirms the above findings IMPRESSION: 1. No emergent large vessel occlusion or high-grade intracranial stenosis. 2. Bilateral mixed calcified and noncalcified atherosclerosis of the  common carotid, cervical internal carotid  and proximal intracranial internal carotid arteries. No hemodynamically significant stenosis. 3.  Aortic Atherosclerosis (ICD10-I70.0). Electronically Signed   By: Ulyses Jarred M.D.   On: 11/08/2016 01:33   Mr Brain Wo Contrast  Result Date: 11/08/2016 CLINICAL DATA:  Initial evaluation for alcohol or related which oral seizures, difficulty speaking, expressive aphasia. EXAM: MRI HEAD WITHOUT CONTRAST MRA HEAD WITHOUT CONTRAST TECHNIQUE: Multiplanar, multiecho pulse sequences of the brain and surrounding structures were obtained without intravenous contrast. Angiographic images of the head were obtained using MRA technique without contrast. COMPARISON:  Prior CT from earlier the same day. FINDINGS: MRI HEAD FINDINGS Brain: Generalized cerebral atrophy. Patchy T2/FLAIR hyperintensity within the periventricular and deep white matter both cerebral hemispheres most consistent with chronic small vessel ischemic disease, moderate nature. No abnormal foci of restricted diffusion to suggest acute or subacute ischemia. Gray-white matter differentiation maintained. No evidence for acute or chronic intracranial hemorrhage. No encephalomalacia to suggest chronic infarction. No mass lesion, midline shift or mass effect. Ventricles normal in size without evidence for hydrocephalus. No extra-axial fluid collection. Major dural sinuses are grossly patent. No intrinsic temporal lobe abnormality. Pituitary gland suprasellar region within normal limits. Midline structures intact and normal. Vascular: Major intracranial vascular flow voids are maintained. Skull and upper cervical spine: Craniocervical junction normal. Multilevel degenerate spondylolysis noted within the upper cervical spine without significant stenosis. Bone marrow signal intensity within normal limits. 14 mm T2 hyperintense scalp lesion at the level the vertex noted, of doubtful significance. Scalp soft tissues otherwise  unremarkable. Sinuses/Orbits: Globes and orbital soft tissues within normal limits. Paranasal sinuses are clear. No significant mastoid effusion. Inner ear structures normal. Other: None. MRA HEAD FINDINGS ANTERIOR CIRCULATION: Distal cervical segments of the internal carotid arteries are patent with antegrade flow. Petrous, cavernous, and supraclinoid segments patent without flow-limiting stenosis. ICA termini widely patent. Normal anterior communicating artery. Anterior cerebral artery is widely patent to their distal aspects. M1 segments patent without stenosis or occlusion. MCA bifurcations normal. No proximal M2 occlusion. Distal MCA branches well perfused and symmetric. POSTERIOR CIRCULATION: Vertebral artery is widely patent to the vertebrobasilar junction without stenosis. Posterior inferior cerebral arteries not seen on this exam. Basilar artery widely patent to its distal aspect. Superior cerebral artery is patent bilaterally. Both of the posterior cerebral artery supplied via the basilar artery and are well perfused to their distal aspects without stenosis. No aneurysm or vascular malformation. 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 IMPRESSION: MRI HEAD IMPRESSION: 1. No acute intracranial abnormality identified. 2. Moderate chronic small vessel ischemic disease. MRA HEAD IMPRESSION: Negative intracranial MRA. No large or proximal arterial branch occlusion. No high-grade or correctable stenosis. Electronically Signed   By: Jeannine Boga M.D.   On: 11/08/2016 22:04   Mr Jodene Nam Head Wo Contrast  Result Date: 11/08/2016 CLINICAL DATA:  Initial evaluation for alcohol or related which oral seizures, difficulty speaking, expressive aphasia. EXAM: MRI HEAD WITHOUT CONTRAST MRA HEAD WITHOUT CONTRAST TECHNIQUE: Multiplanar, multiecho pulse sequences of  the brain and surrounding structures were obtained without intravenous contrast. Angiographic images of the head were obtained using MRA technique without contrast. COMPARISON:  Prior CT from earlier the same day. FINDINGS: MRI HEAD FINDINGS Brain: Generalized cerebral atrophy. Patchy T2/FLAIR hyperintensity within the periventricular and deep white matter both cerebral hemispheres most consistent with chronic small vessel ischemic disease, moderate nature. No abnormal  foci of restricted diffusion to suggest acute or subacute ischemia. Gray-white matter differentiation maintained. No evidence for acute or chronic intracranial hemorrhage. No encephalomalacia to suggest chronic infarction. No mass lesion, midline shift or mass effect. Ventricles normal in size without evidence for hydrocephalus. No extra-axial fluid collection. Major dural sinuses are grossly patent. No intrinsic temporal lobe abnormality. Pituitary gland suprasellar region within normal limits. Midline structures intact and normal. Vascular: Major intracranial vascular flow voids are maintained. Skull and upper cervical spine: Craniocervical junction normal. Multilevel degenerate spondylolysis noted within the upper cervical spine without significant stenosis. Bone marrow signal intensity within normal limits. 14 mm T2 hyperintense scalp lesion at the level the vertex noted, of doubtful significance. Scalp soft tissues otherwise unremarkable. Sinuses/Orbits: Globes and orbital soft tissues within normal limits. Paranasal sinuses are clear. No significant mastoid effusion. Inner ear structures normal. Other: None. MRA HEAD FINDINGS ANTERIOR CIRCULATION: Distal cervical segments of the internal carotid arteries are patent with antegrade flow. Petrous, cavernous, and supraclinoid segments patent without flow-limiting stenosis. ICA termini widely patent. Normal anterior communicating artery. Anterior cerebral artery is widely patent to their distal  aspects. M1 segments patent without stenosis or occlusion. MCA bifurcations normal. No proximal M2 occlusion. Distal MCA branches well perfused and symmetric. POSTERIOR CIRCULATION: Vertebral artery is widely patent to the vertebrobasilar junction without stenosis. Posterior inferior cerebral arteries not seen on this exam. Basilar artery widely patent to its distal aspect. Superior cerebral artery is patent bilaterally. Both of the posterior cerebral artery supplied via the basilar artery and are well perfused to their distal aspects without stenosis. No aneurysm or vascular malformation. 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 IMPRESSION: MRI HEAD IMPRESSION: 1. No acute intracranial abnormality identified. 2. Moderate chronic small vessel ischemic disease. MRA HEAD IMPRESSION: Negative intracranial MRA. No large or proximal arterial branch occlusion. No high-grade or correctable stenosis. Electronically Signed   By: Jeannine Boga M.D.   On: 11/08/2016 22:04    EEG Impression: This awake and asleepEEG is normal except for excess amount of diffuse low voltage beta activity.  Clinical Correlation: Diffuse low voltage beta activity is commonly seen with sedating medications such as benzodiazepines. In the absence of sedating medications, anxiety and hyperthyroidism may produce generalized beta activity. The absence of epileptiform discharges does not exclude a clinical diagnosis of epilepsy. Clinical correlation is advised.  ECHOCARDIOGRAM Study Conclusions  - Left ventricle: The cavity size was normal. Systolic function was normal. The estimated ejection fraction was in the range of 60% to 65%. Wall motion was normal; there were no regional wall motion abnormalities. There was an increased relative contribution of atrial  contraction to ventricular filling. Doppler parameters are consistent with abnormal left ventricular relaxation (grade 1 diastolic dysfunction). - Aortic valve: Trileaflet; normal thickness, mildly calcified leaflets. - Atrial septum: The septum bowed from left to right, consistent with increased left atrial pressure. - Tricuspid valve: There was mild regurgitation.  Subjective: Seen and examined at beside and was much improved. No nausea or vomiting. No hallucinations. Patient was alert and oriented. No problems overnight. Ready to go home.   Discharge Exam: Vitals:   11/10/16 0800 11/10/16 0900  BP: (!) 142/84   Pulse: 87 99  Resp: 18 18  Temp:    SpO2: 99% 100%   Vitals:   11/10/16 0400 11/10/16 0734 11/10/16 0800 11/10/16 0900  BP: 120/77 (!) 157/91 (!) 142/84   Pulse: 79 96 87 99  Resp: 20 15 18 18   Temp: 98.4 F (36.9 C) 97.8 F (36.6 C)    TempSrc: Oral Oral    SpO2: 98% 100% 99% 100%  Weight:      Height:       General: Pt is alert, awake, not in acute distress Cardiovascular: RRR, S1/S2 +, no rubs, no gallops Respiratory: CTA bilaterally, no wheezing, no rhonchi Abdominal: Soft, NT, ND, bowel sounds + Extremities: no edema, no cyanosis  The results of significant diagnostics from this hospitalization (including imaging, microbiology, ancillary and laboratory) are listed below for reference.    Microbiology: Recent Results (from the past 240 hour(s))  MRSA PCR Screening     Status: None   Collection Time: 11/08/16  4:58 AM  Result Value Ref Range Status   MRSA by PCR NEGATIVE NEGATIVE Final    Comment:        The GeneXpert MRSA Assay (FDA approved for NASAL specimens only), is one component of a comprehensive MRSA colonization surveillance program. It is not intended to diagnose MRSA infection nor to guide or monitor treatment for MRSA infections.     Labs: BNP (last 3 results) No results for input(s): BNP in the last 8760 hours. Basic  Metabolic Panel:  Recent Labs Lab 11/07/16 2155 11/07/16 2216 11/08/16 0524 11/09/16 0832 11/10/16 0754  NA 137 136  --  139 139  K 3.9 4.1  --  3.5 3.5  CL 96* 97*  --  108 112*  CO2 26  --   --  23 20*  GLUCOSE 97 93  --  82 81  BUN 12 12  --  7 <5*  CREATININE 0.70 0.60  --  0.68 0.64  CALCIUM 9.0  --   --  8.2* 7.9*  MG  --   --  2.3 2.0 2.0  PHOS  --   --  2.3* 1.9* 2.0*   Liver Function Tests:  Recent Labs Lab 11/07/16 2155 11/09/16 0832 11/10/16 0754  AST 23 17 20   ALT 18 15 14   ALKPHOS 73 51 48  BILITOT 0.9 0.4 0.6  PROT 8.0 5.9* 5.4*  ALBUMIN 4.3 3.3* 3.0*   No results for input(s): LIPASE, AMYLASE in the last 168 hours. No results for input(s): AMMONIA in the last 168 hours. CBC:  Recent Labs Lab 11/07/16 2155 11/07/16 2216 11/09/16 0832 11/10/16 0754  WBC 10.2  --  7.1 6.9  NEUTROABS 8.4*  --  4.7 4.7  HGB 13.9 14.3 12.8 12.0  HCT 40.5 42.0 39.3 37.5  MCV 91.2  --  92.9 93.8  PLT 277  --  254 212   Cardiac Enzymes:  Recent Labs Lab 11/08/16 0524  TROPONINI <0.03   BNP: Invalid input(s): POCBNP CBG: No results for input(s): GLUCAP in the last 168 hours. D-Dimer No results for input(s): DDIMER in the last 72 hours. Hgb A1c  Recent Labs  11/08/16 0524  HGBA1C 5.3   Lipid Profile  Recent Labs  11/08/16 0524  CHOL 180  HDL 62  LDLCALC 99  TRIG 96  CHOLHDL 2.9   Thyroid function studies No results for input(s): TSH, T4TOTAL, T3FREE, THYROIDAB in the last 72 hours.  Invalid input(s): FREET3 Anemia work up No results for input(s): VITAMINB12, FOLATE, FERRITIN, TIBC, IRON, RETICCTPCT in the last 72 hours. Urinalysis    Component Value Date/Time   COLORURINE STRAW (A) 11/10/2016 0529   APPEARANCEUR CLEAR 11/10/2016  0529   LABSPEC 1.003 (L) 11/10/2016 0529   PHURINE 7.0 11/10/2016 0529   GLUCOSEU NEGATIVE 11/10/2016 0529   HGBUR NEGATIVE 11/10/2016 0529   BILIRUBINUR NEGATIVE 11/10/2016 0529   KETONESUR NEGATIVE  11/10/2016 0529   PROTEINUR NEGATIVE 11/10/2016 0529   NITRITE NEGATIVE 11/10/2016 0529   LEUKOCYTESUR NEGATIVE 11/10/2016 0529   Sepsis Labs Invalid input(s): PROCALCITONIN,  WBC,  LACTICIDVEN Microbiology Recent Results (from the past 240 hour(s))  MRSA PCR Screening     Status: None   Collection Time: 11/08/16  4:58 AM  Result Value Ref Range Status   MRSA by PCR NEGATIVE NEGATIVE Final    Comment:        The GeneXpert MRSA Assay (FDA approved for NASAL specimens only), is one component of a comprehensive MRSA colonization surveillance program. It is not intended to diagnose MRSA infection nor to guide or monitor treatment for MRSA infections.    Time coordinating discharge: 35 minutes  SIGNED:  Kerney Elbe, DO [facility] 11/10/2016, 10:41 AM Pager [phone]  If 7PM-7AM, please contact night-coverage [email or web address] Password [email or web address]

## 2016-11-10 NOTE — Care Management Note (Addendum)
Case Management Note  Patient Details  Name: Yvette Barber MRN: [id number] Date of Birth: [birth year]  Subjective/Objective:    Pt admitted with ETOH withdrawal seizures.                   Action/Plan:  PTA independent from home.  Pt states she has PCP that she wants to keep Dr Shelia Media with [facility]  Pt states that even though she no longer has medical insurance she prefers to stay with her PCP.  Pt states that she has been able to pay for her medications out of pocket including the new medications for discharge (Pt given goodrx coupon for Hydroxyzine).  Pt declined assistance with appts at local clinics.  Unit secretary to set up PCP appt for discharge follow up    Expected Discharge Date:                  Expected Discharge Plan:  Home/Self Care  In-House Referral:     Discharge planning Services  CM Consult  Post Acute Care Choice:    Choice offered to:     DME Arranged:    DME Agency:     HH Arranged:    HH Agency:     Status of Service:     If discussed at [employer], dates discussed:    Additional Comments:  Maryclare Labrador, RN 11/10/2016, 9:36 AM

## 2016-11-10 NOTE — Progress Notes (Signed)
Pt states she understands discharge paperwork and has enough money for her taxi and medicine. Taxi called and waiting discharge.

## 2016-11-10 NOTE — Clinical Social Work Note (Signed)
Clinical Social Work Assessment  Patient Details  Name: Yvette Barber MRN: [id number] Date of Birth: 1955-11-04  Date of referral:  11/10/16               Reason for consult:  Housing Concerns/Homelessness, Substance Use/ETOH Abuse                Permission sought to share information with:    Permission granted to share information::  No  Name::        Agency::     Relationship::     Contact Information:     Housing/Transportation Living arrangements for the past 2 months:  Single Family Home Source of Information:  Patient, Medical Team Patient Interpreter Needed:  None Criminal Activity/Legal Involvement Pertinent to Current Situation/Hospitalization:  No - Comment as needed Significant Relationships:  Friend Lives with:  Roommate Do you feel safe going back to the place where you live?  Yes Need for family participation in patient care:  Yes (Comment)  Care giving concerns:  Substance abuse.   Social Worker assessment / plan:  CSW met with patient. No supports at bedside. CSW introduced role and inquired about interest in substance abuse resources. Patient is agreeable. CSW provided packet for inpatient and outpatient treatment centers in [facility] and other nearby areas. Patient also stated she needed a cab ride home. She has no one that can pick her up but can pay for the cab. Phone number for [employer] [employer] given to nurse secretary to call when patient is ready. Patient confirmed that she lives at home with a roommate. No further concerns. CSW signing off as social work intervention is no longer needed. Patient is discharging home today.  Employment status:  Cytogeneticist information:  Self Pay (Medicaid Pending) PT Recommendations:  No Follow Up Information / Referral to community resources:  Residential Substance Abuse Treatment Options, Outpatient Substance Abuse Treatment Options  Patient/Family's Response to care:  Patient agreeable to receiving  resources. Patient's roommate supportive and involved in patient's care. Patient appreciated social work intervention.  Patient/Family's Understanding of and Emotional Response to Diagnosis, Current Treatment, and Prognosis:  Patient has a good understanding of the reason for admission and the social work consult. Patient appears happy with hospital care.  Emotional Assessment Appearance:  Appears stated age Attitude/Demeanor/Rapport:  Other (Pleasant) Affect (typically observed):  Accepting, Appropriate, Calm, Pleasant Orientation:  Oriented to Self, Oriented to Place, Oriented to  Time, Oriented to Situation Alcohol / Substance use:  Alcohol Use, Tobacco Use Psych involvement (Current and /or in the community):  No (Comment)  Discharge Needs  Concerns to be addressed:  Substance Abuse Concerns Readmission within the last 30 days:  No Current discharge risk:  Substance Abuse Barriers to Discharge:  No Barriers Identified   Candie Chroman, LCSW 11/10/2016, 11:48 AM

## 2016-11-28 ENCOUNTER — Ambulatory Visit: Payer: Self-pay | Admitting: Gynecologic Oncology

## 2019-01-13 ENCOUNTER — Emergency Department (HOSPITAL_COMMUNITY): Payer: Self-pay

## 2019-01-13 ENCOUNTER — Encounter (HOSPITAL_COMMUNITY): Payer: Self-pay | Admitting: Emergency Medicine

## 2019-01-13 ENCOUNTER — Emergency Department (HOSPITAL_COMMUNITY)
Admission: EM | Admit: 2019-01-13 | Discharge: 2019-01-13 | Disposition: A | Discharge status: Home / Self Care | Payer: Self-pay | Attending: Emergency Medicine | Admitting: Emergency Medicine

## 2019-01-13 DIAGNOSIS — F17210 Nicotine dependence, cigarettes, uncomplicated: Secondary | ICD-10-CM | POA: Insufficient documentation

## 2019-01-13 DIAGNOSIS — Z79899 Other long term (current) drug therapy: Secondary | ICD-10-CM | POA: Insufficient documentation

## 2019-01-13 DIAGNOSIS — R600 Localized edema: Secondary | ICD-10-CM | POA: Insufficient documentation

## 2019-01-13 DIAGNOSIS — N83209 Unspecified ovarian cyst, unspecified side: Secondary | ICD-10-CM | POA: Insufficient documentation

## 2019-01-13 DIAGNOSIS — Z8673 Personal history of transient ischemic attack (TIA), and cerebral infarction without residual deficits: Secondary | ICD-10-CM | POA: Insufficient documentation

## 2019-01-13 DIAGNOSIS — J45909 Unspecified asthma, uncomplicated: Secondary | ICD-10-CM | POA: Insufficient documentation

## 2019-01-13 DIAGNOSIS — R112 Nausea with vomiting, unspecified: Secondary | ICD-10-CM | POA: Insufficient documentation

## 2019-01-13 LAB — CBC WITH DIFFERENTIAL/PLATELET
Abs Immature Granulocytes: 0.02 10*3/uL (ref 0.00–0.07)
Basophils Absolute: 0.1 10*3/uL (ref 0.0–0.1)
Basophils Relative: 1 %
Eosinophils Absolute: 0.1 10*3/uL (ref 0.0–0.5)
Eosinophils Relative: 1 %
HCT: 39 % (ref 36.0–46.0)
Hemoglobin: 12.6 g/dL (ref 12.0–15.0)
Immature Granulocytes: 0 %
Lymphocytes Relative: 11 %
Lymphs Abs: 1.1 10*3/uL (ref 0.7–4.0)
MCH: 27.3 pg (ref 26.0–34.0)
MCHC: 32.3 g/dL (ref 30.0–36.0)
MCV: 84.6 fL (ref 80.0–100.0)
Monocytes Absolute: 0.8 10*3/uL (ref 0.1–1.0)
Monocytes Relative: 8 %
Neutro Abs: 7.8 10*3/uL — ABNORMAL HIGH (ref 1.7–7.7)
Neutrophils Relative %: 79 %
Platelets: 384 10*3/uL (ref 150–400)
RBC: 4.61 MIL/uL (ref 3.87–5.11)
RDW: 13.5 % (ref 11.5–15.5)
WBC: 9.9 10*3/uL (ref 4.0–10.5)
nRBC: 0 % (ref 0.0–0.2)

## 2019-01-13 LAB — TYPE AND SCREEN
ABO/RH(D): A POS
Antibody Screen: NEGATIVE

## 2019-01-13 LAB — ABO/RH: ABO/RH(D): A POS

## 2019-01-13 LAB — LACTIC ACID, PLASMA: Lactic Acid, Venous: 1.4 mmol/L (ref 0.5–1.9)

## 2019-01-13 LAB — COMPREHENSIVE METABOLIC PANEL
ALT: 11 U/L (ref 0–44)
AST: 16 U/L (ref 15–41)
Albumin: 3.7 g/dL (ref 3.5–5.0)
Alkaline Phosphatase: 90 U/L (ref 38–126)
Anion gap: 14 (ref 5–15)
BUN: <5 mg/dL — ABNORMAL LOW (ref 8–23)
CO2: 22 mmol/L (ref 22–32)
Calcium: 9 mg/dL (ref 8.9–10.3)
Chloride: 93 mmol/L — ABNORMAL LOW (ref 98–111)
Creatinine, Ser: 0.71 mg/dL (ref 0.44–1.00)
GFR calc Af Amer: >60 mL/min (ref >60)
GFR calc non Af Amer: >60 mL/min (ref >60)
Glucose, Bld: 95 mg/dL (ref 70–99)
Potassium: 3.6 mmol/L (ref 3.5–5.1)
Sodium: 129 mmol/L — ABNORMAL LOW (ref 135–145)
Total Bilirubin: 0.9 mg/dL (ref 0.3–1.2)
Total Protein: 7.6 g/dL (ref 6.5–8.1)

## 2019-01-13 LAB — PROTIME-INR
INR: 1 (ref 0.8–1.2)
Prothrombin Time: 13.2 seconds (ref 11.4–15.2)

## 2019-01-13 LAB — LIPASE, BLOOD: Lipase: 27 U/L (ref 11–51)

## 2019-01-13 LAB — BRAIN NATRIURETIC PEPTIDE: B Natriuretic Peptide: 56.8 pg/mL (ref 0.0–100.0)

## 2019-01-13 LAB — AMMONIA: Ammonia: 42 umol/L — ABNORMAL HIGH (ref 9–35)

## 2019-01-13 MED ORDER — ONDANSETRON HCL 4 MG PO TABS: 4.0000 mg | ORAL_TABLET | Freq: Four times a day (QID) | ORAL | 0 refills | Status: AC

## 2019-01-13 MED ORDER — THIAMINE HCL 100 MG/ML IJ SOLN
100.0000 mg | Freq: Every day | INTRAMUSCULAR | Status: DC
  Filled 2019-01-13: qty 2

## 2019-01-13 MED ORDER — AMLODIPINE BESYLATE 5 MG PO TABS: 5.0000 mg | ORAL_TABLET | Freq: Every day | ORAL | 0 refills | Status: AC

## 2019-01-13 MED ORDER — SODIUM CHLORIDE 0.9 % IV BOLUS
1000.0000 mL | Freq: Once | INTRAVENOUS | Status: AC
  Administered 2019-01-13: 1000 mL via INTRAVENOUS

## 2019-01-13 MED ORDER — ONDANSETRON HCL 4 MG/2ML IJ SOLN
4.0000 mg | Freq: Once | INTRAMUSCULAR | Status: AC
  Administered 2019-01-13: 4 mg via INTRAVENOUS
  Filled 2019-01-13: qty 2

## 2019-01-13 MED ORDER — VITAMIN B-1 100 MG PO TABS
100.0000 mg | ORAL_TABLET | Freq: Every day | ORAL | Status: DC
  Administered 2019-01-13: 100 mg via ORAL
  Filled 2019-01-13 (×2): qty 1

## 2019-01-13 MED ORDER — IOHEXOL 300 MG/ML  SOLN
100.0000 mL | Freq: Once | INTRAMUSCULAR | Status: AC | PRN
  Administered 2019-01-13: 100 mL via INTRAVENOUS

## 2019-01-13 MED ORDER — LORAZEPAM 2 MG/ML IJ SOLN
0.0000 mg | Freq: Four times a day (QID) | INTRAMUSCULAR | Status: DC
  Administered 2019-01-13: 1 mg via INTRAVENOUS
  Filled 2019-01-13: qty 1

## 2019-01-13 MED ORDER — LORAZEPAM 2 MG/ML IJ SOLN: 0.0000 mg | Freq: Two times a day (BID) | INTRAMUSCULAR | Status: DC

## 2019-01-13 MED ORDER — LORAZEPAM 1 MG PO TABS: 0.0000 mg | ORAL_TABLET | Freq: Four times a day (QID) | ORAL | Status: DC

## 2019-01-13 MED ORDER — DOXYCYCLINE HYCLATE 100 MG PO CAPS: 100.0000 mg | ORAL_CAPSULE | Freq: Two times a day (BID) | ORAL | 0 refills | Status: DC

## 2019-01-13 MED ORDER — LORAZEPAM 1 MG PO TABS: 0.0000 mg | ORAL_TABLET | Freq: Two times a day (BID) | ORAL | Status: DC

## 2019-01-13 NOTE — Discharge Instructions (Addendum)
It is important that you follow-up with Dr. Denman George for further evaluation and treatment of your ovarian mass.  They will most likely plan surgical removal of the mass.  If they do not reach out to you by early next week, please give their office a call.  For your leg wound, take doxycycline as prescribed until completed.  Begin taking Norvasc daily for your blood pressure.  You can follow-up with a primary care provider by calling the number below.  Please return to the emergency department if you develop any new or worsening symptoms including abdominal pain, intractable vomiting, increasing pain, swelling, redness, red streaking from your wound, or any other concerning symptoms.

## 2019-01-13 NOTE — ED Notes (Signed)
Patient transported to CT 

## 2019-01-13 NOTE — ED Provider Notes (Signed)
[facility] EMERG[facility]Y DEPARTMENT Provider Note   CSN: [id number] Arrival date & time: 01/13/19  [id number]     History Chief Complaint  Patient presents with  . abdominal distention    Yvette Barber is a 63 y.o. female with history of alcohol abuse, asthma, ovarian mass who presents with 2-year history of abdominal distention and a 1 year history of swelling of her lower extremities and wound to her right shin.  She has had weeping from bilateral lower legs.  She denies any chest pain, shortness of breath, abdominal pain.  She reports she is started with some nausea and vomiting over the past few days.  She denies any diarrhea, urinary symptoms.  Per chart review, patient was admitted in 2018 for alcohol withdrawal seizure and she was diagnosed with an ovarian mass around 20 cm.  She was told to follow-up with Gyn oncology and has not.  Patient does not remember being told that she had a mass.  HPI     Past Medical History:  Diagnosis Date  . Alcoholism ([place])   . Asthma     Patient Active Problem List   Diagnosis Date Noted  . Tobacco abuse 11/09/2016  . Hypophosphatemia 11/09/2016  . Alcohol abuse 11/08/2016  . Alcohol withdrawal seizure with complication, with unspecified complication ([place]) [id number]  . Aphasia 11/08/2016  . TIA (transient ischemic attack) 11/08/2016  . Alcohol withdrawal ([place]) 11/08/2016  . Mass, ovarian 11/08/2016  . Seizure [facility])     Past Surgical History:  Procedure Laterality Date  . ABDOMINAL HYSTERECTOMY    . BREAST SURGERY    . PLACEMENT OF BREAST IMPLANTS       OB History   No obstetric history on file.     Family History  Problem Relation Age of Onset  . Alcohol abuse Father   . CAD Other   . Dementia Mother   . Diabetes Neg Hx     Social History   Tobacco Use  . Smoking status: Current Every Day Smoker    Packs/day: 1.00    Types: Cigarettes  . Smokeless tobacco: Never Used  Substance Use Topics  .  Alcohol use: Yes    Comment: 13 beers per day  . Drug use: No    Home Medications Prior to Admission medications   Medication Sig Start Date End Date Taking? Authorizing Provider  amLODipine (NORVASC) 5 MG tablet Take 1 tablet (5 mg total) by mouth daily. 01/13/19   Yvette Barber, Yvette Graff, PA-C  doxycycline (VIBRAMYCIN) 100 MG capsule Take 1 capsule (100 mg total) by mouth 2 (two) times daily. 01/13/19   Yvette Barber, Yvette Graff, PA-C  folic acid (FOLVITE) 1 MG tablet Take 1 tablet (1 mg total) by mouth daily. 11/11/16   Yvette Noble Latif, DO  hydrOXYzine (ATARAX/VISTARIL) 25 MG tablet Take 1 tablet (25 mg total) by mouth every 6 (six) hours as needed (anxiety/agitation or CIWA < or = 10). 11/10/16   Yvette Barber, Yvette Latif, DO  loratadine (CLARITIN) 10 MG tablet Take 10 mg by mouth daily as needed for allergies.    [provider]  Multiple Vitamin (MULTIVITAMIN WITH MINERALS) TABS tablet Take 1 tablet by mouth daily. 11/11/16   Yvette Barber, Yvette Latif, DO  nicotine (NICODERM CQ - DOSED IN MG/24 HOURS) 21 mg/24hr patch Place 1 patch (21 mg total) onto the skin daily. 11/11/16   Yvette Barber, Yvette Latif, DO  ondansetron (ZOFRAN) 4 MG tablet Take 1 tablet (4 mg total) by mouth every 6 (  six) hours. 01/13/19   Yvette Barber, Yvette Graff, PA-C  senna-docusate (SENOKOT-S) 8.6-50 MG tablet Take 1 tablet by mouth at bedtime as needed for mild constipation. 11/10/16   Yvette Noble Latif, DO  thiamine 100 MG tablet Take 1 tablet (100 mg total) by mouth daily. 11/11/16   Yvette Elbe, DO    Allergies    Patient has no known allergies.  Review of Systems   Review of Systems  Constitutional: Negative for chills and fever.  HENT: Negative for facial swelling and sore throat.   Respiratory: Negative for shortness of breath.   Cardiovascular: Negative for chest pain.  Gastrointestinal: Positive for abdominal distention, nausea and vomiting. Negative for abdominal pain.  Genitourinary: Negative for dysuria.    Musculoskeletal: Negative for back pain.  Skin: Positive for wound. Negative for rash.  Neurological: Negative for headaches.  Psychiatric/Behavioral: The patient is not nervous/anxious.     Physical Exam Updated Vital Signs BP (!) 167/113   Pulse (!) 110   Temp 97.8 F (36.6 C) (Oral)   Resp 16   SpO2 99%   Physical Exam Vitals and nursing note reviewed.  Constitutional:      General: She is not in acute distress.    Appearance: She is well-developed. She is not diaphoretic.  HENT:     Head: Normocephalic and atraumatic.     Mouth/Throat:     Pharynx: No oropharyngeal exudate.  Eyes:     General: No scleral icterus.       Right eye: No discharge.        Left eye: No discharge.     Conjunctiva/sclera: Conjunctivae normal.     Pupils: Pupils are equal, round, and reactive to light.  Neck:     Thyroid: No thyromegaly.  Cardiovascular:     Rate and Rhythm: Regular rhythm.     Heart sounds: Normal heart sounds. No murmur. No friction rub. No gallop.   Pulmonary:     Effort: Pulmonary effort is normal. No respiratory distress.     Breath sounds: Normal breath sounds. No stridor. No wheezing or rales.  Abdominal:     General: Bowel sounds are normal. There is distension.     Palpations: Abdomen is soft.     Tenderness: There is no abdominal tenderness. There is no guarding or rebound.     Comments: Very large, tense abdominal distention; some tenderness on the right  Musculoskeletal:     Cervical back: Normal range of motion and neck supple.  Lymphadenopathy:     Cervical: No cervical adenopathy.  Skin:    General: Skin is warm and dry.     Coloration: Skin is pale.     Findings: No rash.     Comments: Significant wound to the right lower leg; see photos Serous yellow weeping to bilateral lower extremities, 1+ pitting  Neurological:     Mental Status: She is alert.     Coordination: Coordination normal.     ED Results / Procedures / Treatments   Labs (all labs  ordered are listed, but only abnormal results are displayed) Labs Reviewed  COMPREHENSIVE METABOLIC PANEL - Abnormal; Notable for the following components:      Result Value   Sodium 129 (*)    Chloride 93 (*)    BUN <5 (*)    All other components within normal limits  CBC WITH DIFFERENTIAL/PLATELET - Abnormal; Notable for the following components:   Neutro Abs 7.8 (*)    All other components within  normal limits  AMMONIA - Abnormal; Notable for the following components:   Ammonia 42 (*)    All other components within normal limits  LIPASE, BLOOD  LACTIC ACID, PLASMA  PROTIME-INR  BRAIN NATRIURETIC PEPTIDE  LACTIC ACID, PLASMA  URINALYSIS, ROUTINE W REFLEX MICROSCOPIC  TYPE AND SCREEN  ABO/RH    EKG EKG Interpretation  Date/Time:  Sunday January 13 2019 07:29:20 EST Ventricular Rate:  101 PR Interval:    QRS Duration: 82 QT Interval:  345 QTC Calculation: 448 R Axis:   72 Text Interpretation: Sinus tachycardia Low voltage, precordial leads Since last tracing rate faster Confirmed by Isla Pence [phone]) on 01/13/2019 7:58:30 AM   Radiology CT ABDOMEN PELVIS W CONTRAST  Result Date: 01/13/2019 CLINICAL DATA:  Worsening abdominal distension. EXAM: CT ABDOMEN AND PELVIS WITH CONTRAST TECHNIQUE: Multidetector CT imaging of the abdomen and pelvis was performed using the standard protocol following bolus administration of intravenous contrast. CONTRAST:  145mL OMNIPAQUE IOHEXOL 300 MG/ML  SOLN COMPARISON:  November 08, 2016. FINDINGS: Lower chest: No acute abnormality. Hepatobiliary: No focal liver abnormality is seen. No gallstones, gallbladder wall thickening, or biliary dilatation. Pancreas: Unremarkable. No pancreatic ductal dilatation or surrounding inflammatory changes. Spleen: Normal in size without focal abnormality. Adrenals/Urinary Tract: Adrenal glands appear normal. Right kidney is unremarkable. There appears to be mild left hydronephrosis without obstructing  calculus, but this appears to be due to enlarging pelvic mass. Urinary bladder is unremarkable. No enhancement of the lower pole of left kidney is noted concerning for old infarction. Stomach/Bowel: The stomach appears normal. There is no evidence of dilated or inflamed bowel. The appendix is not clearly visualized. All bowel loops are significantly displaced due to large cystic mass. Vascular/Lymphatic: No significant vascular findings are present. No enlarged abdominal or pelvic lymph nodes. Reproductive: The complex cystic mass identified on prior exam is significantly enlarged currently, now measuring 35 x 34 x 26 cm. It demonstrates a significant solid component measuring 12 x 10 cm with multiple complex septations present as well. 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 Other: No abdominal wall hernia or abnormality. No abdominopelvic ascites. Musculoskeletal: No acute or significant osseous findings. IMPRESSION: 1. The complex cystic mass seen on prior exam is significantly enlarged currently measuring 35 x 34 x 26 cm with significant solid component measuring 12 x 10 cm with multiple complex septations present as well. 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 Consultation with gynecological surgery is highly recommended. 2. Mild left hydronephrosis is noted without obstructing calculus, but this appears to be due to enlarging pelvic mass. 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 3. No other significant abnormality seen in the abdomen or pelvis. Electronically Signed   By: Marijo Conception M.D.   On: 01/13/2019 10:05    Procedures Procedures (including critical care time)  Medications Ordered in ED Medications  LORazepam (ATIVAN) injection 0-4 mg (1 mg Intravenous Given 01/13/19 0754)    Or  LORazepam (ATIVAN) tablet 0-4 mg ( Oral See Alternative 01/13/19 0754)  LORazepam (ATIVAN) injection  0-4 mg (has no administration in time range)    Or  LORazepam (ATIVAN) tablet 0-4 mg (has no administration in time range)  thiamine (VITAMIN B-1) tablet 100 mg (100 mg Oral Given 01/13/19 0908)    Or  thiamine (B-1) injection 100 mg ( Intravenous See Alternative 01/13/19 0908)  ondansetron (ZOFRAN) injection 4 mg (4 mg Intravenous Given  01/13/19 0749)  sodium chloride 0.9 % bolus 1,000 mL (0 mLs Intravenous Stopped 01/13/19 1125)  iohexol (OMNIPAQUE) 300 MG/ML solution 100 mL (100 mLs Intravenous Contrast Given 01/13/19 [id number])    ED Course  I have reviewed the triage vital signs and the nursing notes.  Pertinent labs & imaging results that were available during my care of the patient were reviewed by me and considered in my medical decision making (see chart for details).    MDM Rules/Calculators/A&P      Patient presenting with abdominal distention, nausea, vomiting.  Labs are unremarkable except for low sodium and chloride.  Patient given 1 L of IV saline.  Ammonia mildly elevated.  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  I discussed these findings with Dr. Denman George with gynecologic oncology who advised follow-up in the office for consultation for surgical removal.  Regarding patient's leg wound, will trial outpatient treatment considering this has not been tried previously with doxycycline.  Encouraged PCP follow-up.  Strict return precautions including worsening wound, fever, abdominal pain, intractable vomiting discussed.  Patient blood pressure elevated throughout the course.  Will start Norvasc low-dose.  Patient understands and agrees with plan.  Patient discharged in satisfactory condition.  Patient also guided by my attending, Dr. Gilford Raid, who guided the  patient's management and agrees with plan.  Final Clinical Impression(s) / ED Diagnoses Final diagnoses:  Cyst of ovary, unspecified laterality    Rx / DC Orders ED Discharge Orders         Ordered    doxycycline (VIBRAMYCIN) 100 MG capsule  2 times daily     01/13/19 1112    ondansetron (ZOFRAN) 4 MG tablet  Every 6 hours     01/13/19 1112    amLODipine (NORVASC) 5 MG tablet  Daily     01/13/19 [address] [place], [place] 01/13/19 1345    Isla Pence, MD 01/13/19 1621

## 2019-01-13 NOTE — ED Triage Notes (Addendum)
Pt presents to ED from home BIB [facility]. Pt c/o abdominal distention that began around 1 year ago. Pt decided to be see today d/t n/v that began yesterday. Pt reports emesis x2. Pt also c/o peripheral edema. During triage pt stops answering questions and appears confused. Pt makes nonsense hand gestures when asked questions. EMS VSS.

## 2019-01-14 ENCOUNTER — Telehealth: Payer: Self-pay | Admitting: *Deleted

## 2019-01-14 NOTE — Telephone Encounter (Signed)
Gave Yvette Barber a new patient appointment with Dr. Berline Lopes on 01-18-19 at 1200 with 1130 arrival. Reviewed check in process with patient as well as sister can be on speaker phone to hear visit. An internal exam will be performed at visit as well. Pt verbalized understanding.

## 2019-01-14 NOTE — Telephone Encounter (Signed)
Called and left the patient a message to call the office back. Need to schedule a new patient appt  

## 2019-01-16 ENCOUNTER — Encounter: Payer: Self-pay | Admitting: Gynecologic Oncology

## 2019-01-17 ENCOUNTER — Emergency Department (HOSPITAL_COMMUNITY): Payer: Self-pay

## 2019-01-17 ENCOUNTER — Other Ambulatory Visit: Payer: Self-pay

## 2019-01-17 ENCOUNTER — Encounter (HOSPITAL_COMMUNITY): Payer: Self-pay

## 2019-01-17 ENCOUNTER — Emergency Department (HOSPITAL_COMMUNITY)
Admission: EM | Admit: 2019-01-17 | Discharge: 2019-01-17 | Disposition: A | Discharge status: Home / Self Care | Payer: Self-pay | Attending: Emergency Medicine | Admitting: Emergency Medicine

## 2019-01-17 DIAGNOSIS — J45909 Unspecified asthma, uncomplicated: Secondary | ICD-10-CM | POA: Insufficient documentation

## 2019-01-17 DIAGNOSIS — F17210 Nicotine dependence, cigarettes, uncomplicated: Secondary | ICD-10-CM | POA: Insufficient documentation

## 2019-01-17 DIAGNOSIS — Z79899 Other long term (current) drug therapy: Secondary | ICD-10-CM | POA: Insufficient documentation

## 2019-01-17 DIAGNOSIS — R1907 Generalized intra-abdominal and pelvic swelling, mass and lump: Secondary | ICD-10-CM | POA: Insufficient documentation

## 2019-01-17 DIAGNOSIS — Z8673 Personal history of transient ischemic attack (TIA), and cerebral infarction without residual deficits: Secondary | ICD-10-CM | POA: Insufficient documentation

## 2019-01-17 DIAGNOSIS — R112 Nausea with vomiting, unspecified: Secondary | ICD-10-CM

## 2019-01-17 LAB — COMPREHENSIVE METABOLIC PANEL
ALT: 13 U/L (ref 0–44)
AST: 16 U/L (ref 15–41)
Albumin: 3.4 g/dL — ABNORMAL LOW (ref 3.5–5.0)
Alkaline Phosphatase: 77 U/L (ref 38–126)
Anion gap: 13 (ref 5–15)
BUN: 5 mg/dL — ABNORMAL LOW (ref 8–23)
CO2: 24 mmol/L (ref 22–32)
Calcium: 9.2 mg/dL (ref 8.9–10.3)
Chloride: 93 mmol/L — ABNORMAL LOW (ref 98–111)
Creatinine, Ser: 0.8 mg/dL (ref 0.44–1.00)
GFR calc Af Amer: >60 mL/min (ref >60)
GFR calc non Af Amer: >60 mL/min (ref >60)
Glucose, Bld: 117 mg/dL — ABNORMAL HIGH (ref 70–99)
Potassium: 3.6 mmol/L (ref 3.5–5.1)
Sodium: 130 mmol/L — ABNORMAL LOW (ref 135–145)
Total Bilirubin: 0.6 mg/dL (ref 0.3–1.2)
Total Protein: 7.1 g/dL (ref 6.5–8.1)

## 2019-01-17 LAB — CBC
HCT: 35.1 % — ABNORMAL LOW (ref 36.0–46.0)
Hemoglobin: 11.6 g/dL — ABNORMAL LOW (ref 12.0–15.0)
MCH: 27.8 pg (ref 26.0–34.0)
MCHC: 33 g/dL (ref 30.0–36.0)
MCV: 84 fL (ref 80.0–100.0)
Platelets: 410 10*3/uL — ABNORMAL HIGH (ref 150–400)
RBC: 4.18 MIL/uL (ref 3.87–5.11)
RDW: 13.7 % (ref 11.5–15.5)
WBC: 8.3 10*3/uL (ref 4.0–10.5)
nRBC: 0 % (ref 0.0–0.2)

## 2019-01-17 LAB — AMMONIA: Ammonia: 11 umol/L (ref 9–35)

## 2019-01-17 LAB — PROTIME-INR
INR: 1 (ref 0.8–1.2)
Prothrombin Time: 13.4 seconds (ref 11.4–15.2)

## 2019-01-17 LAB — LIPASE, BLOOD: Lipase: 26 U/L (ref 11–51)

## 2019-01-17 MED ORDER — ONDANSETRON 8 MG PO TBDP: ORAL_TABLET | ORAL | 0 refills | Status: AC

## 2019-01-17 MED ORDER — ONDANSETRON HCL 4 MG/2ML IJ SOLN
4.0000 mg | Freq: Once | INTRAMUSCULAR | Status: AC
  Administered 2019-01-17: 4 mg via INTRAVENOUS
  Filled 2019-01-17: qty 2

## 2019-01-17 MED ORDER — IOHEXOL 300 MG/ML  SOLN
100.0000 mL | Freq: Once | INTRAMUSCULAR | Status: AC | PRN
  Administered 2019-01-17: 100 mL via INTRAVENOUS

## 2019-01-17 NOTE — ED Triage Notes (Signed)
Pt BIB [facility] for eval of ongoing abd pain, pt seen for same on 12/13. Pt was seen here 3 days ago for same, has f/u appt tmrw. Pt w/ marked abd distension which was present on the previous visit, mild swelling to blt LE. EMS reports intermittent confusion and accusatory remarks en route.

## 2019-01-17 NOTE — ED Notes (Signed)
Patient transported to CT 

## 2019-01-17 NOTE — Progress Notes (Deleted)
GYNECOLOGIC ONCOLOGY NEW PATIENT CONSULTATION   Patient Name: Yvette Barber  Patient Age: 63 y.o. Date of Service: 01/18/19 Referring Provider: No referring provider defined for this encounter.   Primary Care Provider: Patient, No Pcp Per Consulting Provider: Jeral Pinch, MD   Assessment/Plan:  ***   A copy of this note was sent to the patient's referring provider.   Jeral Pinch, MD  Division of Gynecologic Oncology  Department of Obstetrics and Gynecology  [facility]  ___________________________________________  Chief Complaint: No chief complaint on file.   History of Present Illness:  Yvette Barber is a 63 y.o. y.o. female who is seen in consultation at the request of No ref. provider found for an evaluation of large abdominal pelvic mass.  Patient presented to the emergency department on 12/13 with several days of nausea and emesis in the setting of a 2-year history of abdominal distention and 1 year history of lower extremity edema.  She was seen in October 2018 during an admission for alcohol withdrawal symptoms and incidentally found to have a 20 cm pelvic mass.  She did not present for follow-up and does not remember being told that she had a pelvic mass.  Patient was given IV fluids and noted to have a mildly elevated ammonia.  CT scan in the emergency department showed a complex cystic mass measuring 35 x 34 x 26 cm with a solid component measuring 12 x 10 cm.  Left hydronephrosis and possible infarction of the lower pole of the left kidney were also noted.  Patient was also noted to have weeping from lower extremities and plan was for outpatient treatment of leg wound with doxycycline.  The patient presented again to the emergency department on 12/17 for ongoing abdominal pain with marked abdominal distention.  EMS reported intermittent confusion and accusatory remarks.  She endorsed nausea and 2 episodes of emesis.  Her reported  flatus and having bowel movements.  Patient was able to tolerate p.o. medications and CT scan did not show any new acute findings.  ***  Patient's history is notable for significant alcohol abuse with a history of alcohol withdrawal seizure and DTs, as well as an admission for withdrawal seizure and aphasia in October 2018.  At that time, the patient was noted to have no acute abnormalities on MRI of the brain and only diffuse low voltage beta activity on EEG.  She was treated with lorazepam and ultimately discharged on hydroxyzine.  PAST MEDICAL HISTORY:  Past Medical History:  Diagnosis Date  . Alcohol abuse   . Alcoholism ([place])   . Asthma   . History of aphasia 2018  . Hyperplastic colon polyp 2012  . Ovarian mass    incidentally found on imaging in 2018  . Seizure due to alcohol withdrawal ([place]) 2018   admitted with seizures, aphasia, concern for TIA  . Seizure due to alcohol withdrawal, with delirium ([place]) 2016     PAST SURGICAL HISTORY:  Past Surgical History:  Procedure Laterality Date  . ABDOMINAL HYSTERECTOMY  1994   endometriosis, fibroids  . BREAST SURGERY    . PLACEMENT OF BREAST IMPLANTS      OB/GYN HISTORY:  OB History  No obstetric history on file.    No LMP recorded. Patient has had a hysterectomy.  Age at menarche: ***  Age at menopause: *** Hx of HRT: *** Hx of STDs: *** Last pap: *** History of abnormal pap smears: ***  SCREENING STUDIES:  Last mammogram: ***  Last colonoscopy: *** Last bone mineral density: ***  MEDICATIONS: Outpatient Encounter Medications as of 01/18/2019  Medication Sig  . amLODipine (NORVASC) 5 MG tablet Take 1 tablet (5 mg total) by mouth daily.  Marland Kitchen doxycycline (VIBRAMYCIN) 100 MG capsule Take 1 capsule (100 mg total) by mouth 2 (two) times daily.  . folic acid (FOLVITE) 1 MG tablet Take 1 tablet (1 mg total) by mouth daily.  . hydrOXYzine (ATARAX/VISTARIL) 25 MG tablet Take 1 tablet (25 mg total) by mouth every 6 (six)  hours as needed (anxiety/agitation or CIWA < or = 10).  . loratadine (CLARITIN) 10 MG tablet Take 10 mg by mouth daily as needed for allergies.  . Multiple Vitamin (MULTIVITAMIN WITH MINERALS) TABS tablet Take 1 tablet by mouth daily.  . nicotine (NICODERM CQ - DOSED IN MG/24 HOURS) 21 mg/24hr patch Place 1 patch (21 mg total) onto the skin daily.  . ondansetron (ZOFRAN ODT) 8 MG disintegrating tablet 8mg  ODT q8 hours prn nausea  . ondansetron (ZOFRAN) 4 MG tablet Take 1 tablet (4 mg total) by mouth every 6 (six) hours.  . senna-docusate (SENOKOT-S) 8.6-50 MG tablet Take 1 tablet by mouth at bedtime as needed for mild constipation.  . thiamine 100 MG tablet Take 1 tablet (100 mg total) by mouth daily.   No facility-administered encounter medications on file as of 01/18/2019.    ALLERGIES:  No Known Allergies   FAMILY HISTORY:  Family History  Problem Relation Age of Onset  . Alcohol abuse Father   . CAD Other   . Dementia Mother   . Diabetes Neg Hx      SOCIAL HISTORY:    Social Connections:   . Frequency of Communication with Friends and Family: Not on file  . Frequency of Social Gatherings with Friends and Family: Not on file  . Attends Religious Services: Not on file  . Active Member of Clubs or Organizations: Not on file  . Attends Archivist Meetings: Not on file  . Marital Status: Not on file    REVIEW OF SYSTEMS:  Constitutional  Feels well,  ***  ENT Normal appearing ears and nares bilaterally Skin/Breast  No rash, sores, jaundice, itching, dryness Cardiovascular  No chest pain, shortness of breath, or edema  Pulmonary  No cough or wheeze.  Gastro Intestinal  No nausea, vomitting, or diarrhoea. No bright red blood per rectum, no abdominal pain, change in bowel movement, or constipation.  Genito Urinary  No frequency, urgency, dysuria, *** Musculo Skeletal  No myalgia, arthralgia, joint swelling or pain  Neurologic  No weakness, numbness, change  in gait,  Psychology  No depression, anxiety, insomnia.   Physical Exam:  Vital Signs for this encounter:  There were no vitals taken for this visit. There is no height or weight on file to calculate BMI. General: Alert, oriented, no acute distress.  HEENT: Normocephalic, atraumatic. Sclera anicteric.  Chest: Clear to auscultation bilaterally.  Cardiovascular: Regular rate and rhythm, no murmurs, rubs, or gallops.  Abdomen: Obese. Normoactive bowel sounds. Soft, nondistended, nontender to palpation. No masses or hepatosplenomegaly appreciated. No palpable fluid wave.  Extremities: Grossly normal range of motion. Warm, well perfused. No edema bilaterally.  Skin: No rashes or lesions.  Lymphatics: No cervical, supraclavicular, or inguinal adenopathy.  GU:  Normal external female genitalia. ***  No lesions. No discharge or bleeding.             Bladder/urethra:  No lesions or masses, well supported bladder  Vagina: ***             Cervix: Normal appearing, no lesions.             Uterus: *** Small, mobile, no parametrial involvement or nodularity.             Adnexa: *** masses.  Rectal: ***  LABORATORY AND RADIOLOGIC DATA:   CT A/P on 12/17: 1. No acute finding or change from 4 days ago. 2. 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 The mass displaces bowel loops, compresses the aorta, and causes mild left hydronephrosis. 3. No ascites or visible peritoneal nodularity.  CT A/P on 12/13: 1. The complex cystic mass seen on prior exam is significantly enlarged currently measuring 35 x 34 x 26 cm with significant solid component measuring 12 x 10 cm with multiple complex septations present as well. 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 Consultation with gynecological surgery is highly recommended. 2. Mild left hydronephrosis is noted without obstructing calculus, but this appears  to be due to enlarging pelvic mass. 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 3. No other significant abnormality seen in the abdomen or pelvis.  CT A/P on 11/08/2016: 1. Very large complex cystic mass occupying the lower abdomen and pelvis, measuring approximately 21.4 x 21.3 x 9.8 cm. 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 Small amount of associated free fluid within the pelvis. 2. Scattered aortic atherosclerosis. 3. Mild coronary artery calcification. 4. Mild diverticulosis at the mid sigmoid colon, without evidence of diverticulitis. 5. Small left renal cyst. These results were called by telephone at the time of interpretation on 11/08/2016 at [id number] am to Dr. Roxanne Mins, who verbally acknowledged these results.  CA-125: 11/09/16 - 14.4  CBC    Component Value Date/Time   WBC 8.3 01/17/2019 0153   RBC 4.18 01/17/2019 0153   HGB 11.6 (L) 01/17/2019 0153   HCT 35.1 (L) 01/17/2019 0153   PLT 410 (H) 01/17/2019 0153   MCV 84.0 01/17/2019 0153   MCH 27.8 01/17/2019 0153   MCHC 33.0 01/17/2019 0153   RDW 13.7 01/17/2019 0153   LYMPHSABS 1.1 01/13/2019 0748   MONOABS 0.8 01/13/2019 0748   EOSABS 0.1 01/13/2019 0748   BASOSABS 0.1 01/13/2019 0748   CMP Latest Ref Rng & Units 01/17/2019 01/13/2019 11/10/2016  Glucose 70 - 99 mg/dL 117(H) 95 81  BUN 8 - 23 mg/dL 5(L) <5(L) <5(L)  Creatinine 0.44 - 1.00 mg/dL 0.80 0.71 0.64  Sodium 135 - 145 mmol/L 130(L) 129(L) 139  Potassium 3.5 - 5.1 mmol/L 3.6 3.6 3.5  Chloride 98 - 111 mmol/L 93(L) 93(L) 112(H)  CO2 22 - 32 mmol/L 24 22 20(L)  Calcium 8.9 - 10.3 mg/dL 9.2 9.0 7.9(L)  Total Protein 6.5 - 8.1 g/dL 7.1 7.6 5.4(L)  Total Bilirubin 0.3 - 1.2 mg/dL 0.6 0.9 0.6  Alkaline Phos 38 - 126 U/L 77 90 48  AST 15 - 41 U/L 16 16 20   ALT 0 - 44 U/L 13 11 14

## 2019-01-17 NOTE — ED Provider Notes (Signed)
[place] EMERGENCY DEPARTMENT Provider Note   CSN: [id number] Arrival date & time: 01/17/19  0135     History Chief Complaint  Patient presents with  . Abdominal Pain    Yvette Barber is a 63 y.o. female.  The history is provided by the patient.  Emesis Severity:  Mild Duration:  1 hour Timing:  Rare Number of daily episodes:  2 Quality:  Stomach contents Progression:  Unchanged Chronicity:  New Recent urination:  Normal Context: not post-tussive   Relieved by:  Nothing Worsened by:  Nothing Ineffective treatments:  None tried Associated symptoms: no arthralgias, no diarrhea and no fever   Risk factors: alcohol use   Patient with known ovarian mass scheduled to see oncology on 12/18 presents for 2 episodes of emesis.  Passing gas and stool, no diarrhea.  No f/c/r.  No urinary symptoms.       Past Medical History:  Diagnosis Date  . Alcohol abuse   . Alcoholism ([place])   . Asthma   . History of aphasia 2018  . Hyperplastic colon polyp 2012  . Ovarian mass    incidentally found on imaging in 2018  . Seizure due to alcohol withdrawal ([place]) 2018   admitted with seizures, aphasia, concern for TIA  . Seizure due to alcohol withdrawal, with delirium ([place]) 2016    Patient Active Problem List   Diagnosis Date Noted  . Tobacco abuse 11/09/2016  . Hypophosphatemia 11/09/2016  . Alcohol abuse 11/08/2016  . Alcohol withdrawal seizure with complication, with unspecified complication ([place]) [id number]  . Aphasia 11/08/2016  . TIA (transient ischemic attack) 11/08/2016  . Alcohol withdrawal ([place]) 11/08/2016  . Mass, ovarian 11/08/2016  . Seizure [facility])     Past Surgical History:  Procedure Laterality Date  . ABDOMINAL HYSTERECTOMY  1994   endometriosis, fibroids  . BREAST SURGERY    . PLACEMENT OF BREAST IMPLANTS       OB History   No obstetric history on file.     Family History  Problem Relation Age of Onset  . Alcohol abuse Father     . CAD Other   . Dementia Mother   . Diabetes Neg Hx     Social History   Tobacco Use  . Smoking status: Current Every Day Smoker    Packs/day: 1.00    Types: Cigarettes  . Smokeless tobacco: Never Used  Substance Use Topics  . Alcohol use: Yes    Comment: 13 beers per day  . Drug use: No    Home Medications Prior to Admission medications   Medication Sig Start Date End Date Taking? Authorizing Provider  amLODipine (NORVASC) 5 MG tablet Take 1 tablet (5 mg total) by mouth daily. 01/13/19   Law, Bea Graff, PA-C  doxycycline (VIBRAMYCIN) 100 MG capsule Take 1 capsule (100 mg total) by mouth 2 (two) times daily. 01/13/19   Law, Bea Graff, PA-C  folic acid (FOLVITE) 1 MG tablet Take 1 tablet (1 mg total) by mouth daily. 11/11/16   Raiford Noble Latif, DO  hydrOXYzine (ATARAX/VISTARIL) 25 MG tablet Take 1 tablet (25 mg total) by mouth every 6 (six) hours as needed (anxiety/agitation or CIWA < or = 10). 11/10/16   Sheikh, Omair Latif, DO  loratadine (CLARITIN) 10 MG tablet Take 10 mg by mouth daily as needed for allergies.    [provider]  Multiple Vitamin (MULTIVITAMIN WITH MINERALS) TABS tablet Take 1 tablet by mouth daily. 11/11/16   Raiford Noble [place],  DO  nicotine (NICODERM CQ - DOSED IN MG/24 HOURS) 21 mg/24hr patch Place 1 patch (21 mg total) onto the skin daily. 11/11/16   Sheikh, Omair Latif, DO  ondansetron (ZOFRAN) 4 MG tablet Take 1 tablet (4 mg total) by mouth every 6 (six) hours. 01/13/19   Law, Bea Graff, PA-C  senna-docusate (SENOKOT-S) 8.6-50 MG tablet Take 1 tablet by mouth at bedtime as needed for mild constipation. 11/10/16   Raiford Noble Latif, DO  thiamine 100 MG tablet Take 1 tablet (100 mg total) by mouth daily. 11/11/16   Kerney Elbe, DO    Allergies    Patient has no known allergies.  Review of Systems   Review of Systems  Constitutional: Negative for fever.  HENT: Negative for congestion.   Eyes: Negative for visual  disturbance.  Respiratory: Negative for shortness of breath.   Cardiovascular: Negative for chest pain.  Gastrointestinal: Positive for nausea and vomiting. Negative for constipation and diarrhea.  Genitourinary: Negative for difficulty urinating.  Musculoskeletal: Negative for arthralgias.  Neurological: Negative for dizziness.  Psychiatric/Behavioral: Negative for hallucinations.  All other systems reviewed and are negative.   Physical Exam Updated Vital Signs BP (!) 165/90 (BP Location: Left Arm)   Pulse (!) 112   Temp 98.4 F (36.9 C) (Oral)   Resp 18   Ht 5\' 4"  (1.626 m)   Wt 68 kg   SpO2 99% Comment: Simultaneous filing. User may not have seen previous data.  BMI 25.73 kg/m   Physical Exam Vitals and nursing note reviewed.  Constitutional:      General: She is not in acute distress.    Appearance: Normal appearance.  HENT:     Head: Normocephalic and atraumatic.     Nose: Nose normal.  Eyes:     Conjunctiva/sclera: Conjunctivae normal.     Pupils: Pupils are equal, round, and reactive to light.  Cardiovascular:     Rate and Rhythm: Normal rate and regular rhythm.     Pulses: Normal pulses.     Heart sounds: Normal heart sounds.  Pulmonary:     Effort: Pulmonary effort is normal.     Breath sounds: Normal breath sounds.  Abdominal:     General: Bowel sounds are normal.     Palpations: There is mass.     Tenderness: There is no abdominal tenderness. Negative signs include Murphy's sign, Rovsing's sign and McBurney's sign.  Musculoskeletal:        General: Normal range of motion.     Cervical back: Normal range of motion and neck supple.  Skin:    General: Skin is warm and dry.     Capillary Refill: Capillary refill takes less than 2 seconds.  Neurological:     General: No focal deficit present.     Mental Status: She is alert and oriented to person, place, and time.  Psychiatric:        Mood and Affect: Mood normal.        Behavior: Behavior normal.      ED Results / Procedures / Treatments   Labs (all labs ordered are listed, but only abnormal results are displayed) Results for orders placed or performed during the hospital encounter of 01/17/19  Lipase, blood  Result Value Ref Range   Lipase 26 11 - 51 U/L  Comprehensive metabolic panel  Result Value Ref Range   Sodium 130 (L) 135 - 145 mmol/L   Potassium 3.6 3.5 - 5.1 mmol/L   Chloride 93 (L) 98 -  111 mmol/L   CO2 24 22 - 32 mmol/L   Glucose, Bld 117 (H) 70 - 99 mg/dL   BUN 5 (L) 8 - 23 mg/dL   Creatinine, Ser 0.80 0.44 - 1.00 mg/dL   Calcium 9.2 8.9 - 10.3 mg/dL   Total Protein 7.1 6.5 - 8.1 g/dL   Albumin 3.4 (L) 3.5 - 5.0 g/dL   AST 16 15 - 41 U/L   ALT 13 0 - 44 U/L   Alkaline Phosphatase 77 38 - 126 U/L   Total Bilirubin 0.6 0.3 - 1.2 mg/dL   GFR calc non Af Amer >60 >60 mL/min   GFR calc Af Amer >60 >60 mL/min   Anion gap 13 5 - 15  CBC  Result Value Ref Range   WBC 8.3 4.0 - 10.5 K/uL   RBC 4.18 3.87 - 5.11 MIL/uL   Hemoglobin 11.6 (L) 12.0 - 15.0 g/dL   HCT 35.1 (L) 36.0 - 46.0 %   MCV 84.0 80.0 - 100.0 fL   MCH 27.8 26.0 - 34.0 pg   MCHC 33.0 30.0 - 36.0 g/dL   RDW 13.7 11.5 - 15.5 %   Platelets 410 (H) 150 - 400 K/uL   nRBC 0.0 0.0 - 0.2 %  Ammonia  Result Value Ref Range   Ammonia 11 9 - 35 umol/L  Protime-INR  Result Value Ref Range   Prothrombin Time 13.4 11.4 - 15.2 seconds   INR 1.0 0.8 - 1.2   CT ABDOMEN PELVIS W CONTRAST  Result Date: 01/13/2019 CLINICAL DATA:  Worsening abdominal distension. EXAM: CT ABDOMEN AND PELVIS WITH CONTRAST TECHNIQUE: Multidetector CT imaging of the abdomen and pelvis was performed using the standard protocol following bolus administration of intravenous contrast. CONTRAST:  130mL OMNIPAQUE IOHEXOL 300 MG/ML  SOLN COMPARISON:  November 08, 2016. FINDINGS: Lower chest: No acute abnormality. Hepatobiliary: No focal liver abnormality is seen. No gallstones, gallbladder wall thickening, or biliary dilatation. Pancreas:  Unremarkable. No pancreatic ductal dilatation or surrounding inflammatory changes. Spleen: Normal in size without focal abnormality. Adrenals/Urinary Tract: Adrenal glands appear normal. Right kidney is unremarkable. There appears to be mild left hydronephrosis without obstructing calculus, but this appears to be due to enlarging pelvic mass. Urinary bladder is unremarkable. No enhancement of the lower pole of left kidney is noted concerning for old infarction. Stomach/Bowel: The stomach appears normal. There is no evidence of dilated or inflamed bowel. The appendix is not clearly visualized. All bowel loops are significantly displaced due to large cystic mass. Vascular/Lymphatic: No significant vascular findings are present. No enlarged abdominal or pelvic lymph nodes. Reproductive: The complex cystic mass identified on prior exam is significantly enlarged currently, now measuring 35 x 34 x 26 cm. It demonstrates a significant solid component measuring 12 x 10 cm with multiple complex septations present as well. 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 Other: No abdominal wall hernia or abnormality. No abdominopelvic ascites. Musculoskeletal: No acute or significant osseous findings. IMPRESSION: 1. The complex cystic mass seen on prior exam is significantly enlarged currently measuring 35 x 34 x 26 cm with significant solid component measuring 12 x 10 cm with multiple complex septations present as well. 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 Consultation with gynecological surgery is highly recommended. 2. Mild left hydronephrosis is noted without obstructing calculus, but this appears to be due to enlarging pelvic mass. 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  3. No other significant abnormality seen in the abdomen or pelvis. Electronically Signed   By: Marijo Conception M.D.   On: 01/13/2019 10:05     Radiology No results found.  Procedures Procedures (including critical care time)  Medications Ordered in ED Medications  ondansetron (ZOFRAN) injection 4 mg (has no administration in time range)    ED Course  I have reviewed the triage vital signs and the nursing notes.  Pertinent labs & imaging results that were available during my care of the patient were reviewed by me and considered in my medical decision making (see chart for details).    Tolerating POs is scheduled to see GYN onc on 12/18.  Will d/c with zofran  Final Clinical Impression(s) / ED Diagnoses Return for intractable cough, coughing up blood,fevers >100.4 unrelieved by medication, shortness of breath, intractable vomiting, chest pain, shortness of breath, weakness,numbness, changes in speech, facial asymmetry,abdominal pain, passing out,Inability to tolerate liquids or food, cough, altered mental status or any concerns. No signs of systemic illness or infection. The patient is nontoxic-appearing on exam and vital signs are within normal limits.   I have reviewed the triage vital signs and the nursing notes. Pertinent labs &imaging results that were available during my care of the patient were reviewed by me and considered in my medical decision making (see chart for details).  After history, exam, and medical workup I feel the patient has been appropriately medically screened and is safe for discharge home. Pertinent diagnoses were discussed with the patient. Patient was given return   Shizuye Rupert, MD 01/17/19 [phone]

## 2019-01-18 ENCOUNTER — Ambulatory Visit: Payer: Self-pay | Admitting: Gynecologic Oncology

## 2019-01-18 ENCOUNTER — Telehealth: Payer: Self-pay | Admitting: *Deleted

## 2019-01-18 NOTE — Progress Notes (Signed)
GYNECOLOGIC ONCOLOGY NEW PATIENT CONSULTATION   Patient Name: Yvette Barber  Patient Age: 63 y.o. Date of Service: 01/21/19 Referring Provider: No referring provider defined for this encounter.   Primary Care Provider: Patient, No Pcp Per Consulting Provider: Jeral Pinch, MD   Assessment/Plan:  Enormous abdominal pelvic mass likely arising from an ovary.  Reviewed imaging findings with the patient and discussed her large mass and recommendation to proceed with surgical removal.  I suspect this is arising from one of her ovaries.  Given normal Ca-125 2 years ago as well as duration of time that she has had the mass, my suspicion is that this is a nonmalignant process.  We discussed plan for controlled drainage with removal of that ovary to be sent to pathology for frozen section at the time of her surgery.  At minimum, I would recommend a bilateral salpingo-oophorectomy.  If cancer suspected or found on frozen section, then we discussed staging procedures to include lymph node dissection and omentectomy.  Given her active leg cellulitis, my recommendation is to delay surgery until at least next week, so that we are not performing surgery with an ongoing infection.  I have sent a prescription for Bactrim to the patient's pharmacy and we cultured the wound today.  I think she needs to be evaluated in the emergency department for possible IV antibiotics and/or admission.  The patient voiced understanding of this and said she would proceed there on her own.  I called and spoke to the charge nurse to alert them that she was coming.  The patient was scheduled for a preoperative visit next Tuesday to reevaluate her cellulitis.  Additionally, I will reach out to [facility] to see if there is time in the operating room at the end of next week or the beginning of the following week for the patient's case to be there.  Given her overall comorbidities and alcohol use, I suspect she may need more monitoring and  care during the postoperative period.  The patient was amenable to having surgery at [facility] if this is my ultimate recommendation.  All the patient's questions were answered today.  A copy of this note was sent to the patient's referring provider.   Jeral Pinch, MD  Division of Gynecologic Oncology  Department of Obstetrics and Gynecology  [facility]  ___________________________________________  Chief Complaint: No chief complaint on file.   History of Present Illness:  Yvette Barber is a 63 y.o. y.o. female who is seen in consultation at the request of No ref. provider found for an evaluation of large abdominal pelvic mass.  Patient presented to the emergency department on 12/13 with several days of nausea and emesis in the setting of a 2-year history of abdominal distention and 1 year history of lower extremity edema.  She was seen in October 2018 during an admission for alcohol withdrawal symptoms and incidentally found to have a 20 cm pelvic mass.  She did not present for follow-up and does not remember being told that she had a pelvic mass.  Patient was given IV fluids and noted to have a mildly elevated ammonia.  CT scan in the emergency department showed a complex cystic mass measuring 35 x 34 x 26 cm with a solid component measuring 12 x 10 cm.  Left hydronephrosis and possible infarction of the lower pole of the left kidney were also noted.  Patient was also noted to have weeping from lower extremities and plan was for outpatient  treatment of leg wound with doxycycline.  The patient presented again to the emergency department on 12/17 for ongoing abdominal pain with marked abdominal distention.  EMS reported intermittent confusion and accusatory remarks.  She endorsed nausea and 2 episodes of emesis.  Her reported flatus and having bowel movements.  Patient was able to tolerate p.o. medications and CT scan did not show any new acute findings.  Today, the  patient reports overall doing well.  She noticed increasing abdominal girth 1 to 2 years ago.  She has intermittent sharp pain in her left lower abdomen that occasionally radiates to her back.  She takes Advil when this happens, which alleviates her pain somewhat.  She also reports occasional associated nausea.  She endorses decreased appetite as well as early satiety.  When she was seen initially in the emergency department, she was noted to have lower leg cellulitis and was prescribed doxycycline.  She only tolerated 2 doses of this and then secondary to nausea and emesis self discontinued it.  She notes regular bowel function until taking the doxycycline, which has caused some constipation.  She also notes urinary frequency but denies other urinary symptoms.  She denies any vaginal bleeding or discharge.  Patient's history is notable for significant alcohol abuse with a history of alcohol withdrawal seizure and DTs, as well as an admission for withdrawal seizure and aphasia in October 2018.  At that time, the patient was noted to have no acute abnormalities on MRI of the brain and only diffuse low voltage beta activity on EEG.  She was treated with lorazepam and ultimately discharged on hydroxyzine.  Currently, the patient smokes 3 to 4 cigarettes a day.  She drinks 1-4 beers a day and notes occasional tremors of her hands in the last year.  She denies any drinks first thing in the morning in the last few years.  She admits to much heavier alcohol consumption at the time of her previous admissions and emergency department visits back in 2016 and 2018.  She denies any recreational drug use.  PAST MEDICAL HISTORY:  Past Medical History:  Diagnosis Date  . Alcohol abuse   . Alcoholism ([place])   . Asthma   . History of aphasia 2018  . Hyperplastic colon polyp 2012  . Ovarian mass    incidentally found on imaging in 2018  . Seizure due to alcohol withdrawal ([place]) 2018   admitted with seizures, aphasia,  concern for TIA  . Seizure due to alcohol withdrawal, with delirium ([place]) 2016     PAST SURGICAL HISTORY:  Past Surgical History:  Procedure Laterality Date  . ABDOMINAL HYSTERECTOMY  1994   endometriosis, fibroids  . BREAST SURGERY    . PLACEMENT OF BREAST IMPLANTS      OB/GYN HISTORY:  OB History  No obstetric history on file.    No LMP recorded. Patient has had a hysterectomy.  Age at menarche: 46 Age at menopause: unsure, had hysterectomy for fibroids in her late 27s or early 45s Hx of HRT: no Hx of STDs: denies Last pap: unsure, years ago History of abnormal pap smears: no  SCREENING STUDIES:  Last mammogram: years ago  Last colonoscopy: within the last few years, reports was normal  MEDICATIONS: Outpatient Encounter Medications as of 01/18/2019  Medication Sig  . amLODipine (NORVASC) 5 MG tablet Take 1 tablet (5 mg total) by mouth daily.  Marland Kitchen doxycycline (VIBRAMYCIN) 100 MG capsule Take 1 capsule (100 mg total) by mouth 2 (two) times daily.  Marland Kitchen  folic acid (FOLVITE) 1 MG tablet Take 1 tablet (1 mg total) by mouth daily.  . hydrOXYzine (ATARAX/VISTARIL) 25 MG tablet Take 1 tablet (25 mg total) by mouth every 6 (six) hours as needed (anxiety/agitation or CIWA < or = 10).  . loratadine (CLARITIN) 10 MG tablet Take 10 mg by mouth daily as needed for allergies.  . Multiple Vitamin (MULTIVITAMIN WITH MINERALS) TABS tablet Take 1 tablet by mouth daily.  . nicotine (NICODERM CQ - DOSED IN MG/24 HOURS) 21 mg/24hr patch Place 1 patch (21 mg total) onto the skin daily.  . ondansetron (ZOFRAN ODT) 8 MG disintegrating tablet 8mg  ODT q8 hours prn nausea  . ondansetron (ZOFRAN) 4 MG tablet Take 1 tablet (4 mg total) by mouth every 6 (six) hours.  . senna-docusate (SENOKOT-S) 8.6-50 MG tablet Take 1 tablet by mouth at bedtime as needed for mild constipation.  . thiamine 100 MG tablet Take 1 tablet (100 mg total) by mouth daily.   No facility-administered encounter medications on file  as of 01/18/2019.    ALLERGIES:  No Known Allergies   FAMILY HISTORY:  Family History  Problem Relation Age of Onset  . Alcohol abuse Father   . CAD Other   . Dementia Mother   . Diabetes Neg Hx      SOCIAL HISTORY:    Social Connections:   . Frequency of Communication with Friends and Family: Not on file  . Frequency of Social Gatherings with Friends and Family: Not on file  . Attends Religious Services: Not on file  . Active Member of Clubs or Organizations: Not on file  . Attends Archivist Meetings: Not on file  . Marital Status: Not on file    REVIEW OF SYSTEMS:  Reports decreased appetite, fatigue, unexplained weight gain, leg swelling, abdominal distention, abdominal pain, nausea, early satiety, urinary frequency, pelvic pain, leg wounds, difficulty walking, confusion. Denies fevers, chills, fatigue. Denies hearing loss, neck lumps or masses, mouth sores, ringing in ears or voice changes. Denies cough or wheezing.  Denies shortness of breath. Denies chest pain or palpitations. Denies leg swelling. Denies blood in stools, constipation, diarrhea, or vomiting. Denies pain with intercourse, dysuria, hematuria or incontinence. Denies hot flashes, pelvic pain, vaginal bleeding or vaginal discharge.   Denies joint pain, back pain or muscle pain/cramps. Denies itching, rash. Denies dizziness, headaches, numbness or seizures. Denies swollen lymph nodes or glands, denies easy bruising or bleeding. Denies anxiety, depression, or decreased concentration.   Physical Exam:  Today's Vitals   01/21/19 1042  BP: (!) 159/72  Pulse: (!) 107  Resp: 20  Temp: 98.2 F (36.8 C)  TempSrc: Temporal  SpO2: 100%  Weight: 160 lb (72.6 kg)  Height: 5\' 5"  (1.651 m)  PainSc: 0-No pain   Body mass index is 26.63 kg/m. There is no height or weight on file to calculate BMI. General: Alert, oriented, no acute distress.  HEENT: Normocephalic, atraumatic. Sclera anicteric.   Chest: Mild expiratory wheezing bilaterally, otherwise lungs clear to auscultation.  No rhonchi or rales. Cardiovascular: Tachycardic, regular rhythm, no murmurs, rubs, or gallops.  Abdomen: Normoactive bowel sounds.  Tense, distended, firm mass replacing entire abdomen and pelvis.  Increased superficial vasculature noted. No palpable fluid wave.  Mild anasarca noted.  Mild discomfort with palpation, no tenderness. Extremities: Grossly normal range of motion.  2+ edema bilaterally.  Bilateral lower legs erythematous and scaly, right more than left.  Right leg has an approximately 5 x 3 cm opened wound (picture  in media). Skin: No rashes. Lymphatics: No cervical, supraclavicular, or inguinal adenopathy.  GU:  Normal external female genitalia. No lesions. No discharge or bleeding.             Bladder/urethra:  No lesions or masses, well supported bladder             Vagina: Mildly atrophic mucosa, no discharge or bleeding.             Cervix: Surgically absent.  Vaginal cuff intact.             Uterus: Surgically absent             Adnexa: Large mass filling the pelvis and abdomen, not mobile given size and weight.  No nodularity appreciated.  Rectal: Confirms above exam.  LABORATORY AND RADIOLOGIC DATA:   CT A/P on 12/17: 1. No acute finding or change from 4 days ago. 2. 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 The mass displaces bowel loops, compresses the aorta, and causes mild left hydronephrosis. 3. No ascites or visible peritoneal nodularity.  CT A/P on 12/13: 1. The complex cystic mass seen on prior exam is significantly enlarged currently measuring 35 x 34 x 26 cm with significant solid component measuring 12 x 10 cm with multiple complex septations present as well. 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 Consultation with gynecological surgery is highly recommended. 2. Mild left  hydronephrosis is noted without obstructing calculus, but this appears to be due to enlarging pelvic mass. 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 3. No other significant abnormality seen in the abdomen or pelvis.  CT A/P on 11/08/2016: 1. Very large complex cystic mass occupying the lower abdomen and pelvis, measuring approximately 21.4 x 21.3 x 9.8 cm. 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 Small amount of associated free fluid within the pelvis. 2. Scattered aortic atherosclerosis. 3. Mild coronary artery calcification. 4. Mild diverticulosis at the mid sigmoid colon, without evidence of diverticulitis. 5. Small left renal cyst. These results were called by telephone at the time of interpretation on 11/08/2016 at [id number] am to Dr. Roxanne Mins, who verbally acknowledged these results.  CA-125: 11/09/16 - 14.4  CBC    Component Value Date/Time   WBC 8.3 01/17/2019 0153   RBC 4.18 01/17/2019 0153   HGB 11.6 (L) 01/17/2019 0153   HCT 35.1 (L) 01/17/2019 0153   PLT 410 (H) 01/17/2019 0153   MCV 84.0 01/17/2019 0153   MCH 27.8 01/17/2019 0153   MCHC 33.0 01/17/2019 0153   RDW 13.7 01/17/2019 0153   LYMPHSABS 1.1 01/13/2019 0748   MONOABS 0.8 01/13/2019 0748   EOSABS 0.1 01/13/2019 0748   BASOSABS 0.1 01/13/2019 0748   CMP Latest Ref Rng & Units 01/17/2019 01/13/2019 11/10/2016  Glucose 70 - 99 mg/dL 117(H) 95 81  BUN 8 - 23 mg/dL 5(L) <5(L) <5(L)  Creatinine 0.44 - 1.00 mg/dL 0.80 0.71 0.64  Sodium 135 - 145 mmol/L 130(L) 129(L) 139  Potassium 3.5 - 5.1 mmol/L 3.6 3.6 3.5  Chloride 98 - 111 mmol/L 93(L) 93(L) 112(H)  CO2 22 - 32 mmol/L 24 22 20(L)  Calcium 8.9 - 10.3 mg/dL 9.2 9.0 7.9(L)  Total Protein 6.5 - 8.1 g/dL 7.1 7.6 5.4(L)  Total Bilirubin 0.3 - 1.2 mg/dL 0.6 0.9 0.6  Alkaline Phos 38 - 126 U/L 77 90 48  AST 15 - 41 U/L 16 16 20   ALT 0 -  44 U/L 13 11 14

## 2019-01-18 NOTE — Telephone Encounter (Signed)
Patient called and rescheduled her appt from today to Monday  

## 2019-01-21 ENCOUNTER — Encounter (HOSPITAL_COMMUNITY): Payer: Self-pay

## 2019-01-21 ENCOUNTER — Inpatient Hospital Stay: Payer: Self-pay

## 2019-01-21 ENCOUNTER — Inpatient Hospital Stay: Payer: Self-pay | Attending: Gynecologic Oncology | Admitting: Gynecologic Oncology

## 2019-01-21 ENCOUNTER — Emergency Department (HOSPITAL_COMMUNITY)
Admission: EM | Admit: 2019-01-21 | Discharge: 2019-01-21 | Discharge status: Against Medical Advice | Payer: Self-pay | Attending: Emergency Medicine | Admitting: Emergency Medicine

## 2019-01-21 ENCOUNTER — Other Ambulatory Visit: Payer: Self-pay

## 2019-01-21 ENCOUNTER — Encounter: Payer: Self-pay | Admitting: Gynecologic Oncology

## 2019-01-21 VITALS — BP 159/72 | HR 107 | Temp 98.2°F | Resp 20 | Ht 65.0 in | Wt 160.0 lb

## 2019-01-21 DIAGNOSIS — F17210 Nicotine dependence, cigarettes, uncomplicated: Secondary | ICD-10-CM | POA: Insufficient documentation

## 2019-01-21 DIAGNOSIS — L03115 Cellulitis of right lower limb: Secondary | ICD-10-CM

## 2019-01-21 DIAGNOSIS — Z90710 Acquired absence of both cervix and uterus: Secondary | ICD-10-CM | POA: Insufficient documentation

## 2019-01-21 DIAGNOSIS — Y999 Unspecified external cause status: Secondary | ICD-10-CM | POA: Insufficient documentation

## 2019-01-21 DIAGNOSIS — R1900 Intra-abdominal and pelvic swelling, mass and lump, unspecified site: Secondary | ICD-10-CM

## 2019-01-21 DIAGNOSIS — L03119 Cellulitis of unspecified part of limb: Secondary | ICD-10-CM | POA: Insufficient documentation

## 2019-01-21 DIAGNOSIS — Y929 Unspecified place or not applicable: Secondary | ICD-10-CM | POA: Insufficient documentation

## 2019-01-21 DIAGNOSIS — Y939 Activity, unspecified: Secondary | ICD-10-CM | POA: Insufficient documentation

## 2019-01-21 DIAGNOSIS — Z7289 Other problems related to lifestyle: Secondary | ICD-10-CM | POA: Insufficient documentation

## 2019-01-21 DIAGNOSIS — R350 Frequency of micturition: Secondary | ICD-10-CM | POA: Insufficient documentation

## 2019-01-21 DIAGNOSIS — X58XXXA Exposure to other specified factors, initial encounter: Secondary | ICD-10-CM | POA: Insufficient documentation

## 2019-01-21 DIAGNOSIS — Z5321 Procedure and treatment not carried out due to patient leaving prior to being seen by health care provider: Secondary | ICD-10-CM | POA: Insufficient documentation

## 2019-01-21 MED ORDER — SULFAMETHOXAZOLE-TRIMETHOPRIM 800-160 MG PO TABS: 1.0000 | ORAL_TABLET | Freq: Two times a day (BID) | ORAL | 0 refills | Status: DC

## 2019-01-21 NOTE — ED Triage Notes (Signed)
Patient reports a leg wound on her right shin that does not heal. Patient states she was sent here by her physician to receive IV antibiotics.

## 2019-01-21 NOTE — Addendum Note (Signed)
Addended by: Joylene John D on: 01/21/2019 03:34 PM   Modules accepted: Orders

## 2019-01-21 NOTE — Patient Instructions (Signed)
It was a pleasure meeting you today.  I have sent a prescription for Bactrim, an antibiotic, to your pharmacy.  We would like you to be seen in the emergency department to make sure that you do not need admission to the hospital for IV antibiotic treatment.  We will see you a week from Tuesday to reassess your leg and make final plans regarding surgery.  You have a very large ovarian mass, occupying most of the space in your abdomen and pelvis.  Because of your history, I will look into the possibility of performing your surgery at [facility].  We will discuss further at your visit next week.  If you have any questions or concerns before that visit please call our clinic at [phone].

## 2019-01-22 ENCOUNTER — Telehealth: Payer: Self-pay | Admitting: *Deleted

## 2019-01-22 ENCOUNTER — Telehealth: Payer: Self-pay | Admitting: Gynecologic Oncology

## 2019-01-22 NOTE — Telephone Encounter (Signed)
Patient returned Dr Charisse March call. Explained that Dr Berline Lopes has left for the day, but I would leave her a message

## 2019-01-22 NOTE — Telephone Encounter (Signed)
Called the patient to let her know about getting surgery scheduled at [facility] next Thursday, the 31st.  No answer, left message explaining that Katharine Look, our surgical scheduler there, would be calling her to get her set up for a Covid testing appointment and preoperative visit on Tuesday the 29th.  Jeral Pinch MD Gynecologic Oncology

## 2019-01-23 ENCOUNTER — Telehealth: Payer: Self-pay | Admitting: Gynecologic Oncology

## 2019-01-23 NOTE — Telephone Encounter (Signed)
Called patient and LM. Surgery next Thursday, 12/31, at [facility] in [place]. The patient is scheduled for COVID testing at the [facility] (provided time and location of this appointment) followed by a pre-op appointment at the [facility], both on Tuesday, 12/29. Call back requested to confirm she received the message. Will plan to cancel pre-op visit here at [facility] on 12/28.  Jeral Pinch MD Gynecologic Oncology

## 2019-01-24 ENCOUNTER — Telehealth: Payer: Self-pay | Admitting: Gynecologic Oncology

## 2019-01-24 NOTE — Telephone Encounter (Signed)
Called the patient and reviewed plan for next week that she has started the Bactrim and thinks that her leg is looking better yet I reviewed multiple times that we will cancel the appointment on Monday in [place] could she had Covid testing at [place]. in [place] at [place] on Tuesday followed by her preoperative clinic gynecologic oncology clinic in the [facility] hospital.  I gave her the number of clinic to call with questions or she has multifinding she is supposed to go.  We reviewed that if the fellow who sees her for her preoperative visit thinks that she needs he admitted for IV antibiotics for her leg cellulitis and wound, then we will arrange on Tuesday.  Otherwise we will plan for surgery as scheduled Thursday abdomen.  Jeral Pinch MD Gynecologic Oncology

## 2019-01-25 ENCOUNTER — Other Ambulatory Visit: Payer: Self-pay | Admitting: Gynecologic Oncology

## 2019-01-25 DIAGNOSIS — L03119 Cellulitis of unspecified part of limb: Secondary | ICD-10-CM

## 2019-01-25 LAB — AEROBIC CULTURE W GRAM STAIN (SUPERFICIAL SPECIMEN): Gram Stain: NONE SEEN

## 2019-01-25 MED ORDER — CIPROFLOXACIN HCL 500 MG PO TABS: 500.0000 mg | ORAL_TABLET | Freq: Two times a day (BID) | ORAL | 0 refills | Status: AC

## 2019-01-26 ENCOUNTER — Telehealth: Payer: Self-pay | Admitting: Gynecologic Oncology

## 2019-01-26 NOTE — Telephone Encounter (Signed)
Called patient to let her know I had sent in a new prescription for Cipro yesterday given sensitivities of the culture performed on her leg wound.  No answer, the left message.  In my message I asked her to stop taking Bactrim and to start taking the Cipro after she picks it up.  I will call her again later today.  Jeral Pinch MD Gynecologic Oncology

## 2019-01-28 ENCOUNTER — Telehealth: Payer: Self-pay

## 2019-01-28 ENCOUNTER — Telehealth: Payer: Self-pay | Admitting: *Deleted

## 2019-01-28 ENCOUNTER — Encounter: Payer: Self-pay | Admitting: Gynecologic Oncology

## 2019-01-28 ENCOUNTER — Other Ambulatory Visit: Payer: Self-pay | Admitting: Gynecologic Oncology

## 2019-01-28 DIAGNOSIS — R110 Nausea: Secondary | ICD-10-CM

## 2019-01-28 DIAGNOSIS — R1900 Intra-abdominal and pelvic swelling, mass and lump, unspecified site: Secondary | ICD-10-CM

## 2019-01-28 MED ORDER — ONDANSETRON HCL 4 MG PO TABS: 4.0000 mg | ORAL_TABLET | Freq: Three times a day (TID) | ORAL | 0 refills | Status: AC | PRN

## 2019-01-28 NOTE — Telephone Encounter (Signed)
Patient called and left message stating "I got Dr Charisse March message and will stop the pills and start the others when I get them tomorrow. I also have a question for Dr Berline Lopes."

## 2019-01-28 NOTE — Telephone Encounter (Signed)
Called patient Back. No answer. Left voicemail with callback number to speak with either Barbaraann Share or Melissa if she still has a question.  Jeral Pinch MD

## 2019-01-28 NOTE — Telephone Encounter (Signed)
LM for patient to remind her of appointments to morrow for Covid test at 1015 and Pre-op appointment at 1145.

## 2019-01-28 NOTE — Telephone Encounter (Signed)
Spoke with Dr. Berline Lopes who attempted multiple times to reach Yvette Barber and left detailed messages stressing the importance of the appointments and surgery. Dr. Berline Lopes sent in refill of Zofran 4 mg tabs to her pharmacy. Pt not answering phone to relay message. Dr. Berline Lopes will give [facility] a heads up that pt may not keep the appointments .

## 2019-01-28 NOTE — Telephone Encounter (Signed)
Ms Lenzy stated that she is out of her Zofran tablets and wanted a refill. Told Ms Ewton that this would need to be discussed with Dr. Berline Lopes and she would be called back.   In the course of the conversation this nurse asked her if she received my messages reminding her of the [facility] appointments there. She said no. Began reviewing appointments and she stated that she needed to cancel the appointments for tomorrow as she has a 2 hour drive there and she needs to locate and get back her dog.  Told her that she needs to keep these appointments in order to have surgery Thursday 01-31-19. She requested that the surgery be rescheduled. Reiterated the Importance of the surgery and that it would help with the nausea to have mass removed.  She stated that she knew that but needs to get her dog. Told her that this information would be relayed to Dr. Berline Lopes.

## 2019-01-28 NOTE — Progress Notes (Signed)
Called patient after she spoke with office staff about not being able to come to [place] for COVID testing and pre-op. I tried to reach her multiple times with no answer. As was explained to her by Barbaraann Share, I explained in my message that I strongly urged her to come for surgery due to her symptoms (abdominal sx, nausea, leg wound) being a result of her large abdominopelvic mass. If she is unable to make her appointment tomorrow, she will not be able to have surgery on Thursday as scheduled. I don't know how quickly I will be able to get her surgery rescheduled as I have limited OR time in [place].  Jeral Pinch MD Gynecologic Oncology

## 2019-01-28 NOTE — Telephone Encounter (Signed)
Following up on previous calls to remind patient of appointments tomorrow at [facility].

## 2019-01-28 NOTE — Addendum Note (Signed)
Addended by: Baruch Merl on: 01/28/2019 11:45 AM   Modules accepted: Orders

## 2019-01-29 ENCOUNTER — Ambulatory Visit: Payer: Self-pay | Admitting: Gynecologic Oncology

## 2019-01-30 ENCOUNTER — Telehealth: Payer: Self-pay | Admitting: Gynecologic Oncology

## 2019-01-30 NOTE — Telephone Encounter (Signed)
Called patient again this morning to discuss plan given did not show for her Covid test or preoperative to have appointment at [facility] yesterday.  No answer.  Left voicemail that I would need to cancel her surgery.  I have asked her to call clinic so that we can discuss next steps.  Jeral Pinch MD Gynecologic Oncology

## 2019-02-13 ENCOUNTER — Telehealth: Payer: Self-pay

## 2019-02-13 ENCOUNTER — Telehealth: Payer: Self-pay | Admitting: Gynecologic Oncology

## 2019-02-13 NOTE — Telephone Encounter (Signed)
Pt stated that she is experiencing pain on the right side of her back. It began last night 9/10. Dull and  constant  No N/V  Reviewed with Dr. Berline Lopes. Pt needs to have surgery at [facility]. Pt is ameniable to this. Told her that [facility] will be calling her to set up a pre op appointment for 02-18-19 or 02-19-19 in [place].  Surgery will possibly be 1-20 or 02-21-19.  She needs to line up her transportation for these days. She also needs to answer her phone so these appointments can be scheduled when [facility] calls. The area code is [phone] for the hospital. Pt verbalized understanding.

## 2019-02-13 NOTE — Telephone Encounter (Signed)
Patient called office today with abdominal symptoms including pain.  She voiced understanding that she will need to have surgery at [facility] in [place].  Today she says she does not remember coming to our office or seeing me previously.  Book with Tresa Garter, the surgery scheduler at [facility].  We are going to try to schedule surgery for next Wednesday the 20th or Thursday the [facility] will call the patient to set up a preoperative visit and Covid testing 2 days before scheduled surgery.  Jeral Pinch MD Gynecologic Oncology

## 2019-02-15 ENCOUNTER — Telehealth: Payer: Self-pay | Admitting: Gynecologic Oncology

## 2019-02-15 NOTE — Telephone Encounter (Signed)
Spoke with the patient yesterday after Tresa Garter was able to get her surgery scheduled but had not been able to reach the patient.  Katharine Look was able to talk to the patient's sister and relayed the times of her preoperative appointments as well as surgery next Thursday.  With the patient's permission, I called her sister this morning.  I had a long discussion with her sister about her concerns regarding the patient's memory loss and substance abuse.  There is a history of early onset dementia in their family.  Recently, the patient has been exhibiting increasing short-term memory loss.  Caren Griffins will do all within her power to make sure the patient comes to her preoperative appointments on Tuesday and plans to drive her to [facility] with her son.  We discussed the possibility of admission on Tuesday and planning for surgery on Thursday depending on the status of the patient's leg wound as well as lab work.  Jeral Pinch MD Gynecologic Oncology

## 2019-02-19 ENCOUNTER — Emergency Department (HOSPITAL_COMMUNITY): Payer: Self-pay

## 2019-02-19 ENCOUNTER — Emergency Department (HOSPITAL_COMMUNITY)
Admission: EM | Admit: 2019-02-19 | Discharge: 2019-02-19 | Disposition: A | Discharge status: Other Acute Inpatient Hospital | Payer: Self-pay | Attending: Emergency Medicine | Admitting: Emergency Medicine

## 2019-02-19 ENCOUNTER — Telehealth: Payer: Self-pay | Admitting: *Deleted

## 2019-02-19 ENCOUNTER — Other Ambulatory Visit: Payer: Self-pay

## 2019-02-19 ENCOUNTER — Encounter (HOSPITAL_COMMUNITY): Payer: Self-pay | Admitting: Emergency Medicine

## 2019-02-19 DIAGNOSIS — R531 Weakness: Secondary | ICD-10-CM

## 2019-02-19 DIAGNOSIS — T148XXA Other injury of unspecified body region, initial encounter: Secondary | ICD-10-CM

## 2019-02-19 DIAGNOSIS — R000 Tachycardia, unspecified: Secondary | ICD-10-CM | POA: Insufficient documentation

## 2019-02-19 DIAGNOSIS — R1084 Generalized abdominal pain: Secondary | ICD-10-CM | POA: Insufficient documentation

## 2019-02-19 DIAGNOSIS — Z20822 Contact with and (suspected) exposure to covid-19: Secondary | ICD-10-CM | POA: Insufficient documentation

## 2019-02-19 DIAGNOSIS — Z79899 Other long term (current) drug therapy: Secondary | ICD-10-CM | POA: Insufficient documentation

## 2019-02-19 DIAGNOSIS — D3910 Neoplasm of uncertain behavior of unspecified ovary: Secondary | ICD-10-CM | POA: Insufficient documentation

## 2019-02-19 DIAGNOSIS — F17210 Nicotine dependence, cigarettes, uncomplicated: Secondary | ICD-10-CM | POA: Insufficient documentation

## 2019-02-19 DIAGNOSIS — L089 Local infection of the skin and subcutaneous tissue, unspecified: Secondary | ICD-10-CM | POA: Insufficient documentation

## 2019-02-19 DIAGNOSIS — R0902 Hypoxemia: Secondary | ICD-10-CM | POA: Insufficient documentation

## 2019-02-19 DIAGNOSIS — A498 Other bacterial infections of unspecified site: Secondary | ICD-10-CM | POA: Insufficient documentation

## 2019-02-19 LAB — CBC WITH DIFFERENTIAL/PLATELET
Abs Immature Granulocytes: 0.07 10*3/uL (ref 0.00–0.07)
Basophils Absolute: 0 10*3/uL (ref 0.0–0.1)
Basophils Relative: 0 %
Eosinophils Absolute: 0 10*3/uL (ref 0.0–0.5)
Eosinophils Relative: 0 %
HCT: 33.3 % — ABNORMAL LOW (ref 36.0–46.0)
Hemoglobin: 10.4 g/dL — ABNORMAL LOW (ref 12.0–15.0)
Immature Granulocytes: 1 %
Lymphocytes Relative: 4 %
Lymphs Abs: 0.5 10*3/uL — ABNORMAL LOW (ref 0.7–4.0)
MCH: 26.9 pg (ref 26.0–34.0)
MCHC: 31.2 g/dL (ref 30.0–36.0)
MCV: 86.3 fL (ref 80.0–100.0)
Monocytes Absolute: 1.2 10*3/uL — ABNORMAL HIGH (ref 0.1–1.0)
Monocytes Relative: 9 %
Neutro Abs: 11.3 10*3/uL — ABNORMAL HIGH (ref 1.7–7.7)
Neutrophils Relative %: 86 %
Platelets: 333 10*3/uL (ref 150–400)
RBC: 3.86 MIL/uL — ABNORMAL LOW (ref 3.87–5.11)
RDW: 14.1 % (ref 11.5–15.5)
WBC: 13 10*3/uL — ABNORMAL HIGH (ref 4.0–10.5)
nRBC: 0 % (ref 0.0–0.2)

## 2019-02-19 LAB — COMPREHENSIVE METABOLIC PANEL
ALT: 13 U/L (ref 0–44)
ALT: 17 U/L (ref 0–44)
AST: 23 U/L (ref 15–41)
AST: 20 U/L (ref 15–41)
Albumin: 2.9 g/dL — ABNORMAL LOW (ref 3.5–5.0)
Albumin: 2.8 g/dL — ABNORMAL LOW (ref 3.5–5.0)
Alkaline Phosphatase: 74 U/L (ref 38–126)
Alkaline Phosphatase: 71 U/L (ref 38–126)
Anion gap: 16 — ABNORMAL HIGH (ref 5–15)
Anion gap: 8 (ref 5–15)
BUN: 50 mg/dL — ABNORMAL HIGH (ref 8–23)
BUN: 45 mg/dL — ABNORMAL HIGH (ref 8–23)
CO2: 16 mmol/L — ABNORMAL LOW (ref 22–32)
CO2: 24 mmol/L (ref 22–32)
Calcium: 8.8 mg/dL — ABNORMAL LOW (ref 8.9–10.3)
Calcium: 8.8 mg/dL — ABNORMAL LOW (ref 8.9–10.3)
Chloride: 98 mmol/L (ref 98–111)
Chloride: 100 mmol/L (ref 98–111)
Creatinine, Ser: 0.76 mg/dL (ref 0.44–1.00)
Creatinine, Ser: 0.73 mg/dL (ref 0.44–1.00)
GFR calc Af Amer: >60 mL/min (ref >60)
GFR calc Af Amer: >60 mL/min (ref >60)
GFR calc non Af Amer: >60 mL/min (ref >60)
GFR calc non Af Amer: >60 mL/min (ref >60)
Glucose, Bld: 91 mg/dL (ref 70–99)
Glucose, Bld: 107 mg/dL — ABNORMAL HIGH (ref 70–99)
Potassium: 4.7 mmol/L (ref 3.5–5.1)
Potassium: 4.3 mmol/L (ref 3.5–5.1)
Sodium: 130 mmol/L — ABNORMAL LOW (ref 135–145)
Sodium: 132 mmol/L — ABNORMAL LOW (ref 135–145)
Total Bilirubin: 0.7 mg/dL (ref 0.3–1.2)
Total Bilirubin: 1 mg/dL (ref 0.3–1.2)
Total Protein: 6.3 g/dL — ABNORMAL LOW (ref 6.5–8.1)
Total Protein: 6.3 g/dL — ABNORMAL LOW (ref 6.5–8.1)

## 2019-02-19 LAB — PROTIME-INR
INR: 1 (ref 0.8–1.2)
Prothrombin Time: 13.4 seconds (ref 11.4–15.2)

## 2019-02-19 LAB — CBC
HCT: 31.1 % — ABNORMAL LOW (ref 36.0–46.0)
Hemoglobin: 10 g/dL — ABNORMAL LOW (ref 12.0–15.0)
MCH: 27.2 pg (ref 26.0–34.0)
MCHC: 32.2 g/dL (ref 30.0–36.0)
MCV: 84.5 fL (ref 80.0–100.0)
Platelets: 369 10*3/uL (ref 150–400)
RBC: 3.68 MIL/uL — ABNORMAL LOW (ref 3.87–5.11)
RDW: 13.9 % (ref 11.5–15.5)
WBC: 11.9 10*3/uL — ABNORMAL HIGH (ref 4.0–10.5)
nRBC: 0 % (ref 0.0–0.2)

## 2019-02-19 LAB — AMMONIA: Ammonia: 22 umol/L (ref 9–35)

## 2019-02-19 LAB — RESPIRATORY PANEL BY RT PCR (FLU A&B, COVID)
Influenza A by PCR: NEGATIVE
Influenza B by PCR: NEGATIVE
SARS Coronavirus 2 by RT PCR: NEGATIVE

## 2019-02-19 LAB — LACTIC ACID, PLASMA: Lactic Acid, Venous: 1 mmol/L (ref 0.5–1.9)

## 2019-02-19 MED ORDER — LORAZEPAM 2 MG/ML IJ SOLN: 0.0000 mg | Freq: Two times a day (BID) | INTRAMUSCULAR | Status: DC

## 2019-02-19 MED ORDER — LORAZEPAM 1 MG PO TABS: 0.0000 mg | ORAL_TABLET | Freq: Four times a day (QID) | ORAL | Status: DC

## 2019-02-19 MED ORDER — CIPROFLOXACIN IN D5W 400 MG/200ML IV SOLN
400.0000 mg | Freq: Once | INTRAVENOUS | Status: AC
  Administered 2019-02-19: 400 mg via INTRAVENOUS
  Filled 2019-02-19: qty 200

## 2019-02-19 MED ORDER — THIAMINE HCL 100 MG PO TABS: 100.0000 mg | ORAL_TABLET | Freq: Every day | ORAL | Status: DC

## 2019-02-19 MED ORDER — LORAZEPAM 1 MG PO TABS: 0.0000 mg | ORAL_TABLET | Freq: Two times a day (BID) | ORAL | Status: DC

## 2019-02-19 MED ORDER — THIAMINE HCL 100 MG/ML IJ SOLN
100.0000 mg | Freq: Every day | INTRAMUSCULAR | Status: DC
  Administered 2019-02-19: 100 mg via INTRAVENOUS
  Filled 2019-02-19: qty 2

## 2019-02-19 MED ORDER — SODIUM CHLORIDE 0.9 % IV SOLN: Freq: Once | INTRAVENOUS | Status: AC

## 2019-02-19 MED ORDER — LORAZEPAM 2 MG/ML IJ SOLN
0.0000 mg | Freq: Four times a day (QID) | INTRAMUSCULAR | Status: DC
  Administered 2019-02-19: 2 mg via INTRAVENOUS
  Filled 2019-02-19: qty 1

## 2019-02-19 NOTE — ED Notes (Signed)
When PT GET A BED ASSIGNMENT THEY WILL BE  GOING TO [facility]

## 2019-02-19 NOTE — ED Notes (Signed)
Dr Sedonia Small @ bedside. Aware O2 sats mid 70's. Pt placed on 3L [place].

## 2019-02-19 NOTE — Telephone Encounter (Signed)
Returned the patient's sister's call she stated "Pam was taken by EMS to [facility] ER. She is in very bad sharpe both mentally and physically. Her skin is hanging off her face. Her eyes and checks are sunk in. She is not eating or drinking and having a hard time urinating. EMS said she was dehydrated and her breathing was bad." Explained to the sister that I would give Melissa APP the message and it would be give to Dr Berline Lopes, as she ins in the OR. Explained to the sister for her to call [facility] and speak with Pam's nurse and give them this information. Message given to [facility] APP. Message routed to Dr Berline Lopes.

## 2019-02-19 NOTE — ED Provider Notes (Addendum)
[place] EMERGENCY DEPARTMENT Provider Note   CSN: [id number] Arrival date & time: 02/19/19  1320     History Chief Complaint  Patient presents with  . Weakness    Yvette Barber is a 64 y.o. female.  HPI    This adult female who is a very poor historian presents with concern of fatigue, dyspnea.  Patient's history is most notable for history of ovarian mass, demonstrated end of last year, with planned surgical exploration tomorrow. Patient notes that she was too weak to proceed to [facility] where the procedure was planned.  With this weakness she called EMS, was brought here for evaluation.  She denies interval fever, vomiting, states that she is progressively weak, with loss of appetite, generalized, but not focal weakness and discomfort. History is provided by the patient and EMS providers. EMS providers note the patient was tachypneic, otherwise hemodynamically unremarkable in route. The patient herself states that she has been taking her medication as directed, though on secondary interview is clear that the patient is unaware of what medication she is supposed to be taking and is not taking many of her prescribed medications.  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   Past Medical History:  Diagnosis Date  . Alcohol abuse   . Alcoholism ([place])   . Asthma   . History of aphasia 2018  . Hyperplastic colon polyp 2012  . Ovarian mass    incidentally found on imaging in 2018  . Seizure due to alcohol withdrawal ([place]) 2018   admitted with seizures, aphasia, concern for TIA  . Seizure due to alcohol withdrawal, with delirium ([place]) 2016  . Tobacco abuse     Patient Active Problem List   Diagnosis Date Noted  . Tobacco abuse 11/09/2016  . Hypophosphatemia 11/09/2016  . Alcohol abuse 11/08/2016  .  Alcohol withdrawal seizure with complication, with unspecified complication ([place]) [id number]  . Aphasia 11/08/2016  . TIA (transient ischemic attack) 11/08/2016  . Alcohol withdrawal ([place]) 11/08/2016  . Mass, ovarian 11/08/2016  . Seizure [facility])     Past Surgical History:  Procedure Laterality Date  . ABDOMINAL HYSTERECTOMY  1994   endometriosis, fibroids  . BREAST SURGERY    . PLACEMENT OF BREAST IMPLANTS       OB History    Gravida  0   Para  0   Term  0   Preterm  0   AB  0   Living  0     SAB  0   TAB  0   Ectopic  0   Multiple  0   Live Births  0           Family History  Problem Relation Age of Onset  . Alcohol abuse Father   . CAD Other   . Dementia Mother   . Breast cancer Maternal Aunt   . Ovarian cancer Maternal Aunt   . Diabetes Neg Hx   . Colon cancer Neg Hx   . Endometrial cancer Neg Hx     Social History   Tobacco Use  . Smoking status: Current Every Day Smoker    Packs/day: 0.25    Types: Cigarettes  . Smokeless tobacco: Never Used  Substance Use Topics  . Alcohol use: Yes    Comment: 1-4 beers a day  . Drug use: No    Home  Medications Prior to Admission medications   Medication Sig Start Date End Date Taking? Authorizing Provider  famotidine (PEPCID) 10 MG tablet Take 10 mg by mouth 2 (two) times daily.   Yes [provider]  loratadine (CLARITIN) 10 MG tablet Take 10 mg by mouth daily as needed for allergies.   Yes [provider]  amLODipine (NORVASC) 5 MG tablet Take 1 tablet (5 mg total) by mouth daily. Patient not taking: Reported on 02/19/2019 01/13/19   Frederica Kuster, PA-C  folic acid (FOLVITE) 1 MG tablet Take 1 tablet (1 mg total) by mouth daily. Patient not taking: Reported on 01/21/2019 11/11/16   Raiford Noble Latif, DO  Multiple Vitamin (MULTIVITAMIN WITH MINERALS) TABS tablet Take 1 tablet by mouth daily. Patient not taking: Reported on 02/19/2019 11/11/16   Raiford Noble Latif, DO    nicotine (NICODERM CQ - DOSED IN MG/24 HOURS) 21 mg/24hr patch Place 1 patch (21 mg total) onto the skin daily. Patient not taking: Reported on 02/19/2019 11/11/16   Raiford Noble Latif, DO  ondansetron (ZOFRAN ODT) 8 MG disintegrating tablet 8mg  ODT q8 hours prn nausea Patient not taking: Reported on 01/21/2019 01/17/19   Palumbo, April, MD  ondansetron (ZOFRAN) 4 MG tablet Take 1 tablet (4 mg total) by mouth every 6 (six) hours. Patient not taking: Reported on 01/21/2019 01/13/19   Frederica Kuster, PA-C  ondansetron (ZOFRAN) 4 MG tablet Take 1 tablet (4 mg total) by mouth every 8 (eight) hours as needed for up to 15 doses for nausea or vomiting. Patient not taking: Reported on 02/19/2019 01/28/19   Lafonda Mosses, MD  senna-docusate (SENOKOT-S) 8.6-50 MG tablet Take 1 tablet by mouth at bedtime as needed for mild constipation. Patient not taking: Reported on 02/19/2019 11/10/16   Raiford Noble Latif, DO  thiamine 100 MG tablet Take 1 tablet (100 mg total) by mouth daily. Patient not taking: Reported on 02/19/2019 11/11/16   Raiford Noble Latif, DO    Allergies    Doxycycline  Review of Systems   Review of Systems  Constitutional:       Per HPI, otherwise negative  HENT:       Per HPI, otherwise negative  Respiratory:       Per HPI, otherwise negative  Cardiovascular:       Per HPI, otherwise negative  Gastrointestinal: Positive for abdominal pain and nausea. Negative for vomiting.  Endocrine:       Negative aside from HPI  Genitourinary:       Neg aside from HPI   Musculoskeletal:       Per HPI, otherwise negative  Skin: Negative.   Allergic/Immunologic: Positive for immunocompromised state.  Neurological: Positive for weakness. Negative for syncope.  Hematological: Bruises/bleeds easily.    Physical Exam Updated Vital Signs BP (!) 148/105   Pulse (!) 114   Temp 97.6 F (36.4 C) (Oral)   Resp 20   Ht 5\' 5"  (1.651 m)   Wt 72.6 kg   SpO2 97%   BMI 26.63 kg/m    Physical Exam Vitals and nursing note reviewed.  Constitutional:      General: She is not in acute distress.    Appearance: She is cachectic. She is ill-appearing.  HENT:     Head: Normocephalic and atraumatic.  Eyes:     Conjunctiva/sclera: Conjunctivae normal.  Cardiovascular:     Rate and Rhythm: Regular rhythm. Tachycardia present.  Pulmonary:     Effort: Pulmonary effort is normal. Tachypnea present.  No respiratory distress.     Breath sounds: Normal breath sounds. No stridor.  Abdominal:     General: There is no distension.    Skin:    General: Skin is warm and dry.  Neurological:     Mental Status: She is alert and oriented to person, place, and time.     Cranial Nerves: No cranial nerve deficit.     ED Results / Procedures / Treatments   Labs (all labs ordered are listed, but only abnormal results are displayed) Labs Reviewed  RESPIRATORY PANEL BY RT PCR (FLU A&B, COVID)  CBC WITH DIFFERENTIAL/PLATELET  COMPREHENSIVE METABOLIC PANEL    EKG EKG Interpretation  Date/Time:  Tuesday February 19 2019 13:25:30 EST Ventricular Rate:  111 PR Interval:    QRS Duration: 87 QT Interval:  307 QTC Calculation: 418 R Axis:   56 Text Interpretation: Sinus tachycardia Probable left atrial enlargement Low voltage, extremity leads Nonspecific repol abnormality, diffuse leads Artifact Abnormal ECG Confirmed by Carmin Muskrat [phone]) on 02/19/2019 2:08:26 PM   Radiology No results found.  Procedures Procedures (including critical care time)  Medications Ordered in ED Medications  LORazepam (ATIVAN) injection 0-4 mg (has no administration in time range)    Or  LORazepam (ATIVAN) tablet 0-4 mg (has no administration in time range)  LORazepam (ATIVAN) injection 0-4 mg (has no administration in time range)    Or  LORazepam (ATIVAN) tablet 0-4 mg (has no administration in time range)  thiamine tablet 100 mg (has no administration in time range)    Or  thiamine (B-1)  injection 100 mg (has no administration in time range)  0.9 %  sodium chloride infusion (has no administration in time range)    ED Course  I have reviewed the triage vital signs and the nursing notes.  Pertinent labs & imaging results that were available during my care of the patient were reviewed by me and considered in my medical decision making (see chart for details).   Immediately after the initial evaluation consideration of the patient's likely malignancy, need for additional evaluation for complete T diagnosis and therapy I discussed her case with her gynecology oncology team. Given the patient's history of alcohol abuse, some consideration of withdrawal, though she is not seizing, has no overt evidence for this currently, she will start CIWA protocol, received Ativan, fluids as needed.  When the patient has improved hemodynamic status she may be transferred to [facility] for additional care.  4:33 PM IVF running, on monitor the patient has now sinus tach, 105, abnormal Covid test negative  MDM Rules/Calculators/A&P                     This ill-appearing adult female with likely ovarian malignancy presents due to weakness, ongoing discomfort, tachypnea. She is awake and alert, but tachypneic, tachycardic on exam, raising concern for worsening condition, as well as withdrawal.  On is clear the patient is not taking her medication regularly, unclear if she is drinking alcohol persistently.  Patient will require transfer when more hemodynamically appropriate.  The patient's physicians both here and at [facility] are aware of her presentation.  With these considerations the patient was started on a CIWA protocol will receive Ativan as needed, is receiving fluids.  Dr. Sedonia Small is aware of the patient and will dispo when appropriate. Final Clinical Impression(s) / ED Diagnoses Final diagnoses:  Weakness  Tachycardia  Generalized abdominal pain     Carmin Muskrat, MD 02/19/19 1507  Carmin Muskrat, MD 02/19/19 [phone]

## 2019-02-19 NOTE — ED Notes (Signed)
When PT GET A BED ASSIGNMENT THEY WILL BE  GOING TO [facility]

## 2019-02-19 NOTE — ED Notes (Signed)
[facility] at bedside to transfer pt to [facility]. Update given to pt sister

## 2019-02-19 NOTE — Progress Notes (Signed)
Received a call from [facility] after patient's sister called this morning to say that the patient was too weak to be transported for her preoperative visit and Covid testing.  I counseled that if the sister did not feel that she could safely drive the patient, then they should call EMS to be brought to [facility] or [facility].  I was then contacted subsequently by the emergency department physician after the patient arrived here in our system.  Given her complex history as well as alcohol use, I had planned to do the surgery at [facility].  I still feel that this is the safest option.  I spoke with Dr. Skeet Latch who was the attending of the week for GYN oncology there.  Unfortunately, since I suspect the patient is actively withdrawing from alcohol, she would have to be in a stepdown unit for CIWA protocol.  There are currently no stepdown beds.  I let the ED physician here know this.  He will call the transfer center.  I suspect the patient will either need to be observed in the emergency department or admitted until appropriate for a floor bed.  Agree with IV hydration, diet as tolerated, repeat labs.  Would also give thiamine and folate.  Patient may benefit from IV antibiotics depending on the status of her lower extremity wound, in the setting of venous stasis due to her dry norm is abdominal pelvic mass.  Jeral Pinch MD Gynecologic Oncology

## 2019-02-19 NOTE — ED Triage Notes (Signed)
BIB EMS from home. Pt scheduled to have salpingo-oohorectomy tomorrow at [facility] but feels she is to weak to travel there. Had telephone encounter this AM with oncology who suggested she come here to be transferred to [facility]. Abdomen severely distended. Wound to RLE.

## 2019-02-19 NOTE — Progress Notes (Signed)
IV attempt X2 

## 2019-02-19 NOTE — ED Provider Notes (Addendum)
Provider Note MRN:  [id number]  Arrival date & time: 02/19/19    ED Course and Medical Decision Making  Assumed care from Dr. Vanita Panda at shift change.  Ovarian malignancy, needs surgery at [facility], actively withdrawing from alcohol, plan to provide fluids, Ativan, improve her heart rate so that she can be appropriate for floor bed at [facility] as they do not have stepdown beds available.  Will contact Dr. Skeet Latch once heart rate improved.  Text [phone].  Once transfer is approved, [facility] transfer line should be made aware.  Update 7:30 PM: Patient demonstrated clinical improvement, improved heart rate, resting peacefully after Ativan, no longer actively withdrawing.  Accepted for transfer to acute care bed at [facility] by Dr. Skeet Latch, awaiting transport.  Dr. Berline Lopes of [facility] health gynecology/oncology was kind enough to evaluate the patient here in the emergency department, recommending antibiotics for patient's leg wound.  Per sensitivity cultures, will provide dose ciprofloxacin here prior to transfer.  8 PM update: I was called to bedside for clinical change, question of aspiration event, question of hematemesis based on the appearance of the emesis.  Patient does not appear tremulous, does not appear to be actively withdrawing, but now has return of tachycardia in the 110s to 120s, normotensive, but with desaturation into the 70s, is now requiring 2 L nasal cannula.  She is confused.  I no longer feel that she is appropriate for a floor bed, much more appropriate for stepdown unit.  Still protecting her airway and able to answer questions.  Discussed this with Dr. Skeet Latch, there is no stepdown bed available, and there is no guarantee of stepdown bed tomorrow.  Will attempt ED to ED transfer given that patient's definitive management needs to be performed at [facility].  8:42 PM update: [facility] system is not excepting ED to ED transfers given bed availability issues.  We will continue to reassess patient's  condition and urine studies room availability.  Sending repeat labs, lactate, ammonia, chest x-ray.  9:38 PM update: [facility] was able to make a stepdown bed available for the patient, transfer pending.  Dr. Skeet Latch remains the accepting physician.    .Critical Care Performed by: Maudie Flakes, MD Authorized by: Maudie Flakes, MD   Critical care provider statement:    Critical care time (minutes):  45   Critical care was necessary to treat or prevent imminent or life-threatening deterioration of the following conditions:  Metabolic crisis, respiratory failure and CNS failure or compromise (Acute alcohol withdrawal, aspiration event with hypoxia, metabolic acidosis)   Critical care was time spent personally by me on the following activities:  Discussions with consultants, evaluation of patient's response to treatment, examination of patient, ordering and performing treatments and interventions, ordering and review of laboratory studies, ordering and review of radiographic studies, pulse oximetry, re-evaluation of patient's condition, obtaining history from patient or surrogate and review of old charts   I assumed direction of critical care for this patient from another provider in my specialty: yes      Final Clinical Impressions(s) / ED Diagnoses     ICD-10-CM   1. Weakness  R53.1   2. Tachycardia  R00.0   3. Generalized abdominal pain  R10.84   4. Pseudomonas infection  A49.8   5. Wound infection  T14.8XXA    L08.9   6. Hypoxia  R09.02     ED Discharge Orders    None      Discharge Instructions   None     Legrand Como  Viona Gilmore, MD [place] [email or web address]    Maudie Flakes, MD 02/19/19 1936    Maudie Flakes, MD 02/19/19 2141

## 2019-04-01 DEATH — deceased

## 2020-03-22 IMAGING — CT CT ABD-PELV W/ CM
2 of 5 series · 16 of 46 positions shown, 18 images · IV contrast (omnipaque)
Comparison: Four days ago

CLINICAL DATA: Worsening abdominal distention

EXAM:
CT ABDOMEN AND PELVIS WITH CONTRAST
TECHNIQUE: Multidetector CT imaging of the abdomen and pelvis was performed
using the standard protocol following bolus administration of
intravenous contrast.
CONTRAST:  100mL OMNIPAQUE IOHEXOL 300 MG/ML  SOLN

[Series 3: abdomen 5.0 · axial · 0.92mm/px · z∈[+648,+1068]mm · 13 of 97 slices shown, 15 images]
[im 7/97  soft-tissue]
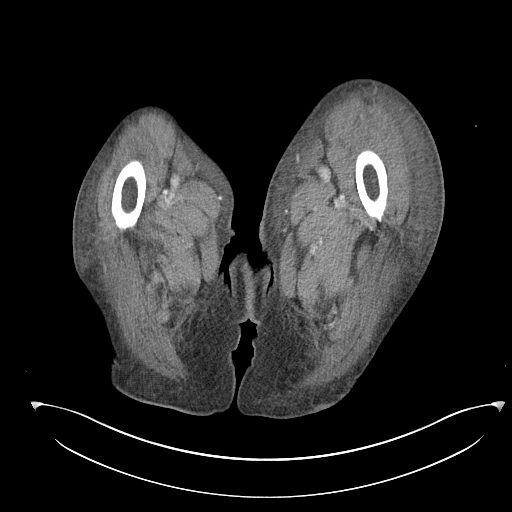
[im 7/97  bone]
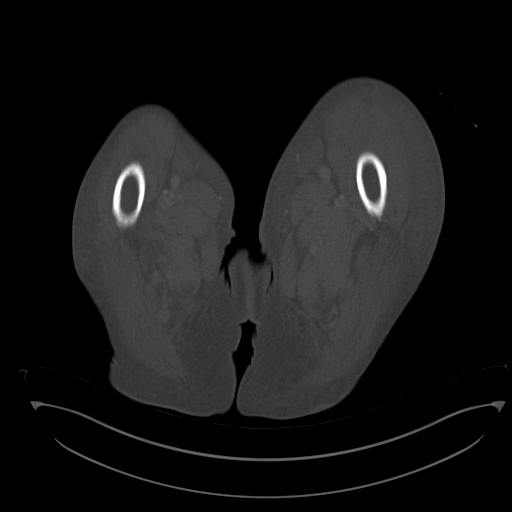
[im 13/97  soft-tissue]
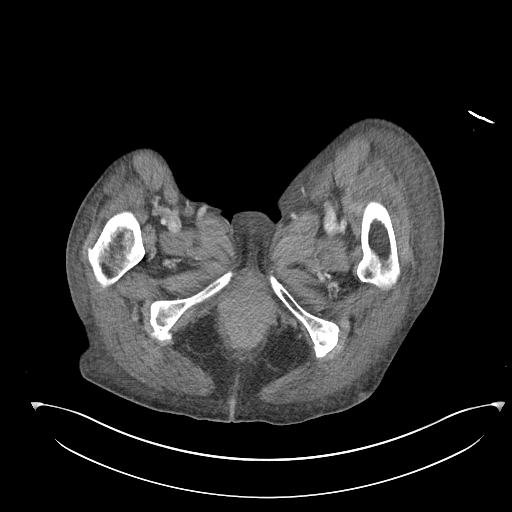
[im 19/97  soft-tissue]
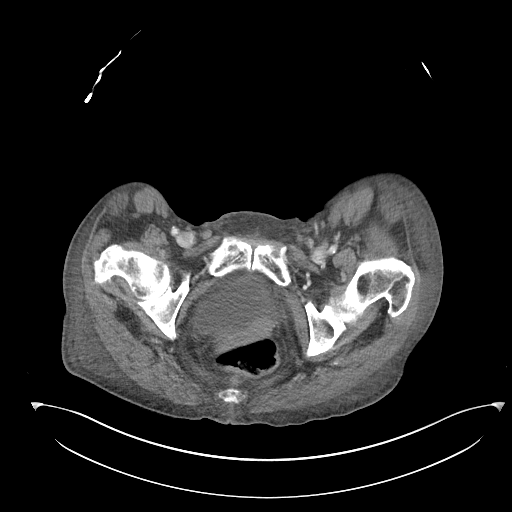
[im 31/97  soft-tissue]
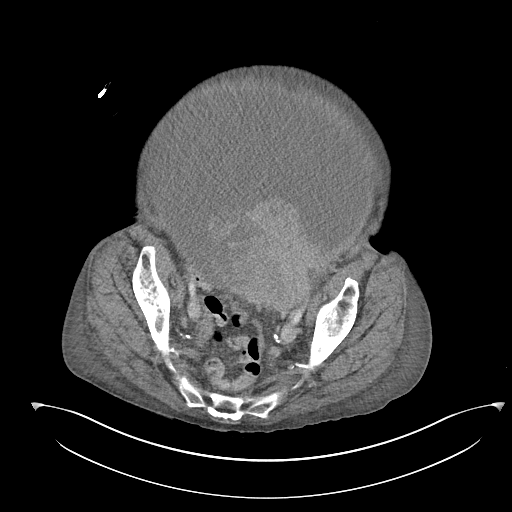
[im 37/97  soft-tissue]
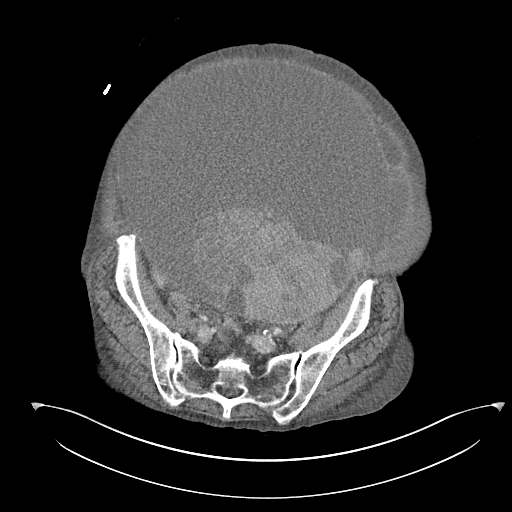
[im 43/97  soft-tissue]
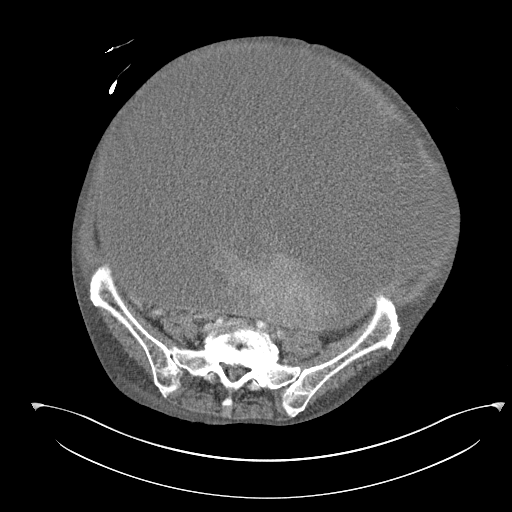
[im 49/97  soft-tissue]
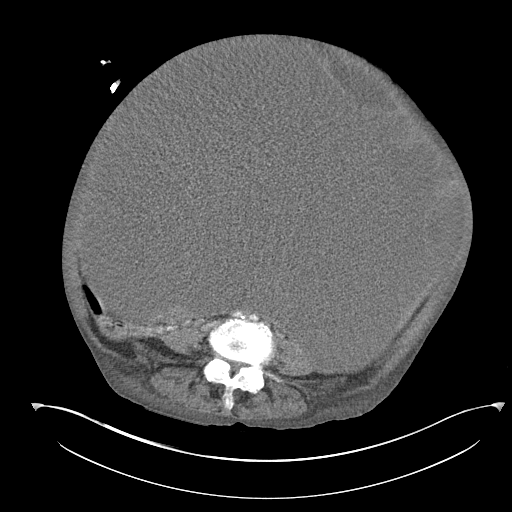
[im 55/97  soft-tissue]
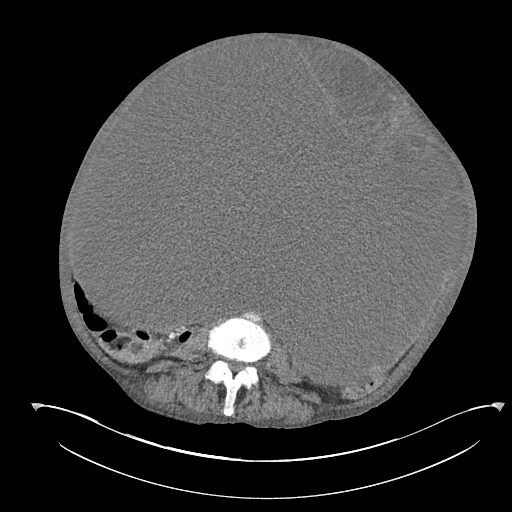
[im 61/97  soft-tissue]
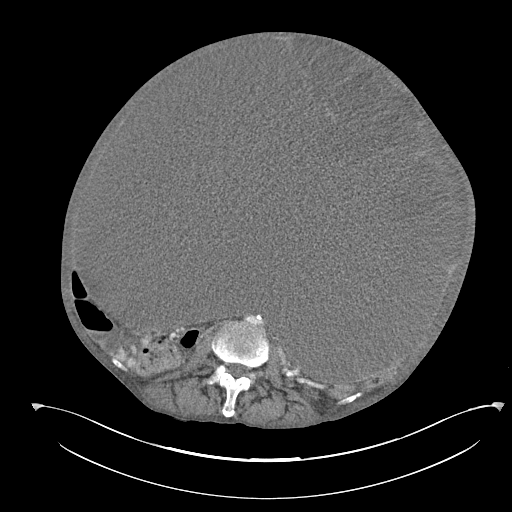
[im 61/97  bone]
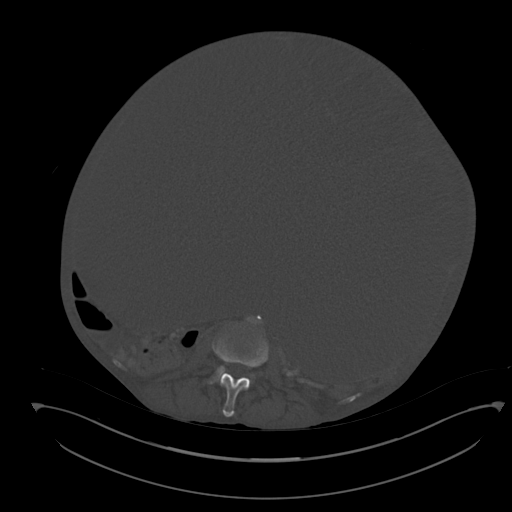
[im 67/97  soft-tissue]
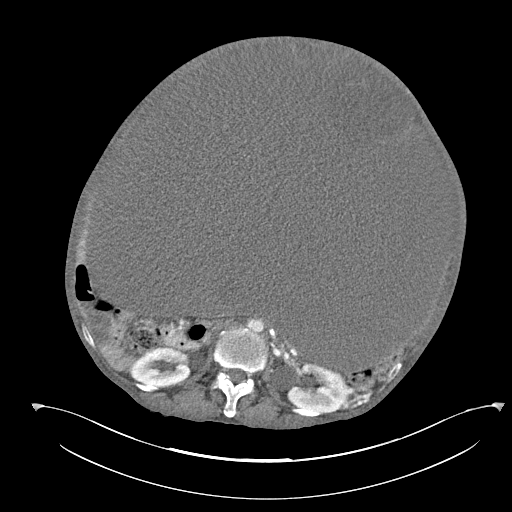
[im 79/97  soft-tissue]
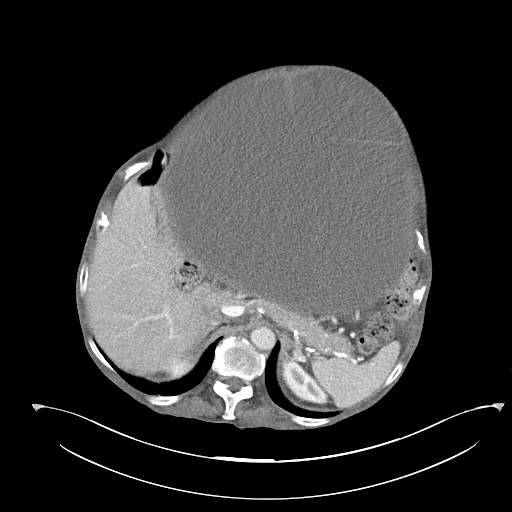
[im 85/97  soft-tissue]
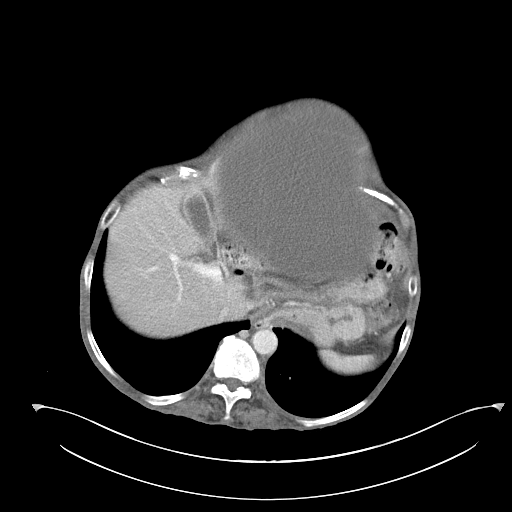
[im 91/97  soft-tissue]
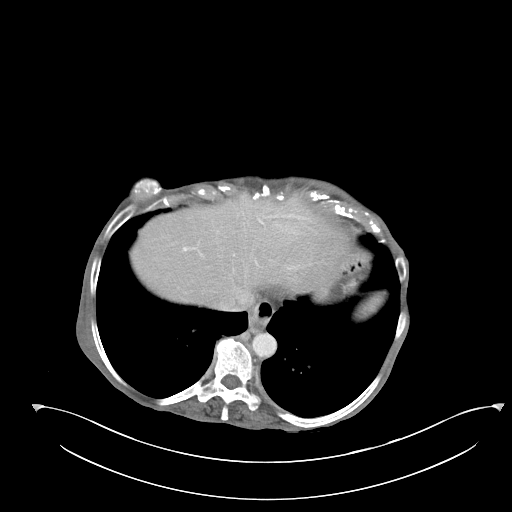

[Series 6: abdomen 3.0 mpr cor · coronal · 0.75mm/px · 3 of 128 slices shown]
[im 43/128  soft-tissue]
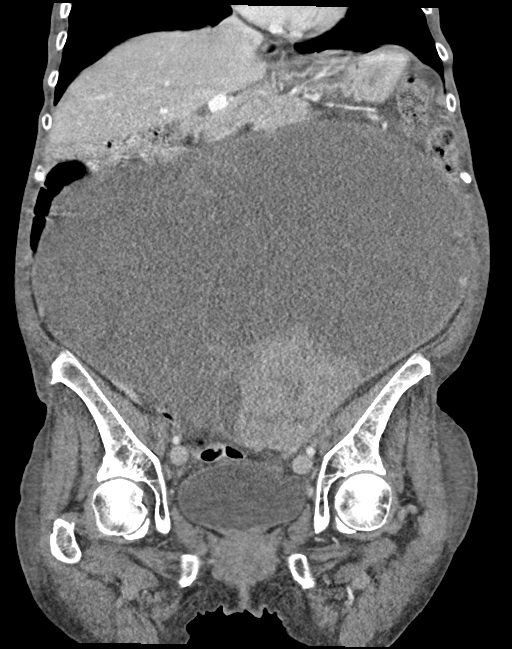
[im 57/128  soft-tissue]
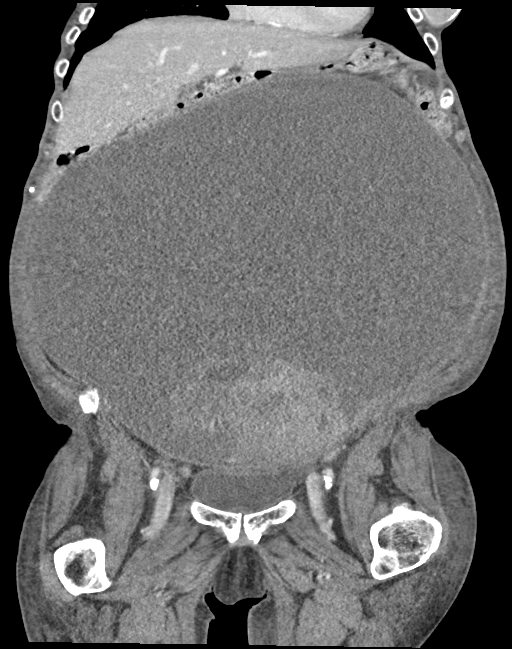
[im 71/128  soft-tissue]
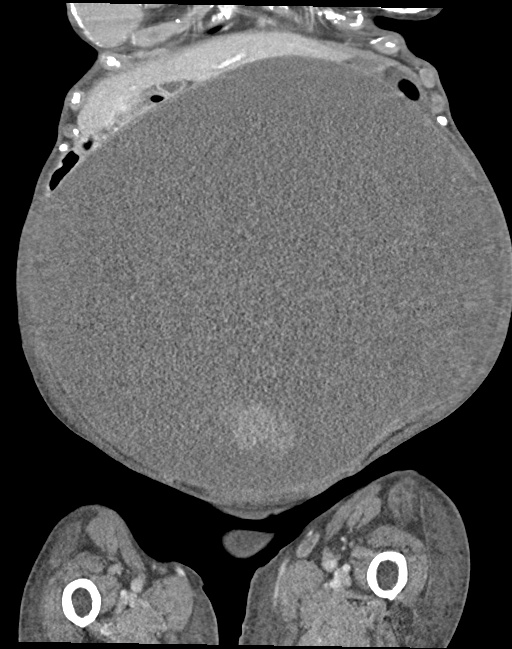

[16 of 46 positions shown; findings below may reference images not displayed]

FINDINGS: Lower chest: Calcified breast implants which are minimally covered.
Coronary calcification seen along the RCA.

Hepatobiliary: Left lower deformity by pelvic mass. No evidence of
metastatic disease.No evidence of biliary obstruction or stone.

Pancreas: Unremarkable.

Spleen: Unremarkable.

Adrenals/Urinary Tract: Negative adrenals. Mild left hydronephrosis
that is stable to improved and attributed to ureteral compression by
pelvic mass. Unremarkable bladder.

Stomach/Bowel: Bowel loops are displaced and flattened by the large
abdominal mass. No evidence of obstruction or inflammation.

Vascular/Lymphatic: The aorta is flattened by the mass. Celiac axis
narrowing which could be partially explained by median arcuate
ligament. Multifocal atherosclerotic calcification. Right iliac
lymph node measuring 8 mm short axis.

Reproductive:Massive pelvic and abdominal mass measuring up to 31 cm
craniocaudal. There are multiple internal septations and nodules.
Better seen on prior abdominal CT this is likely ovarian.
Hysterectomy

Other: No visible ascites or peritoneal nodularity.

Musculoskeletal: No acute abnormalities. Advanced lower lumbar disc
degeneration. Lower thoracic spondylosis with multi-level ankylosis.
IMPRESSION: 1. No acute finding or change from 4 days ago.
2. Massive (31 x 36 cm) pelvic and abdominal complex cystic mass,
likely an ovarian malignancy. The mass displaces bowel loops,
compresses the aorta, and causes mild left hydronephrosis.
3. No ascites or visible peritoneal nodularity.
